# Patient Record
Sex: Female | Born: 1960 | Race: White | Hispanic: No | Smoking: Former smoker
Health system: Southern US, Community
[De-identification: ages and names within clinical notes are randomized; demographics above are authoritative.]

## PROBLEM LIST (undated history)

## (undated) HISTORY — PX: ABDOMINAL HYSTERECTOMY: SHX81

---

## 2001-04-10 ENCOUNTER — Emergency Department (HOSPITAL_COMMUNITY): Admission: EM | Admit: 2001-04-10 | Discharge: 2001-04-10 | Payer: Self-pay | Admitting: Emergency Medicine

## 2003-05-29 ENCOUNTER — Encounter: Payer: Self-pay | Admitting: Emergency Medicine

## 2003-05-29 ENCOUNTER — Emergency Department (HOSPITAL_COMMUNITY): Admission: EM | Admit: 2003-05-29 | Discharge: 2003-05-29 | Payer: Self-pay | Admitting: Emergency Medicine

## 2006-01-15 ENCOUNTER — Ambulatory Visit: Payer: Self-pay | Admitting: Family Medicine

## 2006-02-24 ENCOUNTER — Emergency Department (HOSPITAL_COMMUNITY): Admission: EM | Admit: 2006-02-24 | Discharge: 2006-02-24 | Payer: Self-pay | Admitting: Emergency Medicine

## 2009-02-09 ENCOUNTER — Emergency Department (HOSPITAL_COMMUNITY): Admission: EM | Admit: 2009-02-09 | Discharge: 2009-02-09 | Payer: Self-pay | Admitting: Emergency Medicine

## 2009-03-10 ENCOUNTER — Emergency Department (HOSPITAL_COMMUNITY): Admission: EM | Admit: 2009-03-10 | Discharge: 2009-03-10 | Payer: Self-pay | Admitting: Emergency Medicine

## 2009-05-10 ENCOUNTER — Emergency Department (HOSPITAL_COMMUNITY): Admission: EM | Admit: 2009-05-10 | Discharge: 2009-05-10 | Payer: Self-pay | Admitting: Emergency Medicine

## 2010-10-11 ENCOUNTER — Encounter (HOSPITAL_COMMUNITY): Payer: Self-pay

## 2010-10-11 ENCOUNTER — Emergency Department (HOSPITAL_COMMUNITY): Admit: 2010-10-11 | Discharge: 2010-10-11 | Disposition: A | Discharge status: Home / Self Care | Payer: Self-pay

## 2010-10-11 ENCOUNTER — Emergency Department (HOSPITAL_COMMUNITY)
Admission: EM | Admit: 2010-10-11 | Discharge: 2010-10-11 | Disposition: A | Discharge status: Home / Self Care | Payer: Self-pay | Attending: Emergency Medicine | Admitting: Emergency Medicine

## 2010-10-11 DIAGNOSIS — S40019A Contusion of unspecified shoulder, initial encounter: Secondary | ICD-10-CM | POA: Insufficient documentation

## 2010-10-11 DIAGNOSIS — W1809XA Striking against other object with subsequent fall, initial encounter: Secondary | ICD-10-CM | POA: Insufficient documentation

## 2010-10-11 DIAGNOSIS — Y92009 Unspecified place in unspecified non-institutional (private) residence as the place of occurrence of the external cause: Secondary | ICD-10-CM | POA: Insufficient documentation

## 2011-11-18 ENCOUNTER — Encounter (HOSPITAL_COMMUNITY): Payer: Self-pay | Admitting: *Deleted

## 2011-11-18 ENCOUNTER — Emergency Department (HOSPITAL_COMMUNITY)
Admission: EM | Admit: 2011-11-18 | Discharge: 2011-11-18 | Disposition: A | Discharge status: Home Health | Payer: Self-pay | Attending: Emergency Medicine | Admitting: Emergency Medicine

## 2011-11-18 DIAGNOSIS — Y998 Other external cause status: Secondary | ICD-10-CM | POA: Insufficient documentation

## 2011-11-18 DIAGNOSIS — Y9301 Activity, walking, marching and hiking: Secondary | ICD-10-CM | POA: Insufficient documentation

## 2011-11-18 DIAGNOSIS — S39012A Strain of muscle, fascia and tendon of lower back, initial encounter: Secondary | ICD-10-CM

## 2011-11-18 DIAGNOSIS — W010XXA Fall on same level from slipping, tripping and stumbling without subsequent striking against object, initial encounter: Secondary | ICD-10-CM | POA: Insufficient documentation

## 2011-11-18 DIAGNOSIS — F17200 Nicotine dependence, unspecified, uncomplicated: Secondary | ICD-10-CM | POA: Insufficient documentation

## 2011-11-18 DIAGNOSIS — S20229A Contusion of unspecified back wall of thorax, initial encounter: Secondary | ICD-10-CM | POA: Insufficient documentation

## 2011-11-18 MED ORDER — HYDROCODONE-ACETAMINOPHEN 5-325 MG PO TABS
1.0000 | ORAL_TABLET | Freq: Once | ORAL | Status: DC
  Filled 2011-11-18: qty 1

## 2011-11-18 MED ORDER — BACLOFEN 10 MG PO TABS: 10.0000 mg | ORAL_TABLET | Freq: Three times a day (TID) | ORAL | Status: DC

## 2011-11-18 MED ORDER — METHOCARBAMOL 500 MG PO TABS
1000.0000 mg | ORAL_TABLET | Freq: Once | ORAL | Status: DC
  Filled 2011-11-18: qty 2

## 2011-11-18 MED ORDER — HYDROCODONE-ACETAMINOPHEN 5-325 MG PO TABS: 1.0000 | ORAL_TABLET | ORAL | Status: AC | PRN

## 2011-11-18 MED ORDER — ONDANSETRON HCL 4 MG PO TABS
4.0000 mg | ORAL_TABLET | Freq: Once | ORAL | Status: DC
  Filled 2011-11-18: qty 1

## 2011-11-18 NOTE — Discharge Instructions (Signed)
Please apply ice to the affected area of the lower back. Use baclofen 3 times daily for spasm. Use Tylenol for mild pain, use Norco for more severe pain. Norco and baclofen may cause drowsiness, please use with caution.Please call Dr Lysbeth Galas for recheck next week. See Dr Hilda Lias for evaluation if orthopedic evaluation needed. Contusion A contusion is a deep bruise. Contusions are the result of an injury that caused bleeding under the skin. The contusion may turn blue, purple, or yellow. Minor injuries will give you a painless contusion, but more severe contusions may stay painful and swollen for a few weeks.  CAUSES  A contusion is usually caused by a blow, trauma, or direct force to an area of the body. SYMPTOMS   Swelling and redness of the injured area.   Bruising of the injured area.   Tenderness and soreness of the injured area.   Pain.  DIAGNOSIS  The diagnosis can be made by taking a history and physical exam. An X-ray, CT scan, or MRI may be needed to determine if there were any associated injuries, such as fractures. TREATMENT  Specific treatment will depend on what area of the body was injured. In general, the best treatment for a contusion is resting, icing, elevating, and applying cold compresses to the injured area. Over-the-counter medicines may also be recommended for pain control. Ask your caregiver what the best treatment is for your contusion. HOME CARE INSTRUCTIONS   Put ice on the injured area.   Put ice in a plastic bag.   Place a towel between your skin and the bag.   Leave the ice on for 15 to 20 minutes, 3 to 4 times a day.   Only take over-the-counter or prescription medicines for pain, discomfort, or fever as directed by your caregiver. Your caregiver may recommend avoiding anti-inflammatory medicines (aspirin, ibuprofen, and naproxen) for 48 hours because these medicines may increase bruising.   Rest the injured area.   If possible, elevate the injured area to  reduce swelling.  SEEK IMMEDIATE MEDICAL CARE IF:   You have increased bruising or swelling.   You have pain that is getting worse.   Your swelling or pain is not relieved with medicines.  MAKE SURE YOU:   Understand these instructions.   Will watch your condition.   Will get help right away if you are not doing well or get worse.  Document Released: 06/06/2005 Document Revised: 08/16/2011 Document Reviewed: 07/02/2011 [facility] Patient Information 2012 [place], [place].

## 2011-11-18 NOTE — ED Notes (Signed)
Pt states was up in middle of the night, fell and tripped on hearth of fireplace hitting lower back . Pt has noted abrasions to lower back.

## 2011-11-18 NOTE — ED Notes (Signed)
Pt DC to home with steady gait 

## 2011-11-18 NOTE — ED Provider Notes (Signed)
History     CSN: [id number]  Arrival date & time 11/18/11  1356   First MD Initiated Contact with Patient 11/18/11 1547      Chief Complaint  Patient presents with  . Fall  . Abrasion    (Consider location/radiation/quality/duration/timing/severity/associated sxs/prior treatment) Patient is a 51 y.o. female presenting with fall. The history is provided by the patient.  Fall The accident occurred yesterday. The fall occurred while walking. She landed on a hard floor. Point of impact: loower back. Pain location: lower back. She was ambulatory at the scene.    History reviewed. No pertinent past medical history.  Past Surgical History  Procedure Date  . Abdominal hysterectomy     History reviewed. No pertinent family history.  History  Substance Use Topics  . Smoking status: Current Everyday Smoker -- 1.0 packs/day    Types: Cigarettes  . Smokeless tobacco: Not on file  . Alcohol Use: No    OB History    Grav Para Term Preterm Abortions TAB SAB Ect Mult Living                  Review of Systems  Allergies  Review of patient's allergies indicates not on file.  Home Medications  No current outpatient prescriptions on file.  BP 134/73  Pulse 78  Temp(Src) 98.6 F (37 C) (Oral)  Resp 20  Ht 5\' 2"  (1.575 m)  Wt 130 lb (58.968 kg)  BMI 23.78 kg/m2  SpO2 97%  Physical Exam  Nursing note and vitals reviewed. Constitutional: She is oriented to person, place, and time. She appears well-developed and well-nourished.  Non-toxic appearance.  HENT:  Head: Normocephalic.  Right Ear: Tympanic membrane and external ear normal.  Left Ear: Tympanic membrane and external ear normal.  Eyes: EOM and lids are normal. Pupils are equal, round, and reactive to light.  Neck: Normal range of motion. Neck supple. Carotid bruit is not present.  Cardiovascular: Normal rate, regular rhythm, normal heart sounds, intact distal pulses and normal pulses.   Pulmonary/Chest: Breath  sounds normal. No respiratory distress.  Abdominal: Soft. Bowel sounds are normal. There is no tenderness. There is no guarding.  Musculoskeletal: Normal range of motion.       Patient has soreness with attempted range of motion of the lower back. There is bruising over the lower lumbar area. There is no tenderness at the coccyx area. There is full range of motion of both upper extremities.  Lymphadenopathy:       Head (right side): No submandibular adenopathy present.       Head (left side): No submandibular adenopathy present.    She has no cervical adenopathy.  Neurological: She is alert and oriented to person, place, and time. She has normal strength. No cranial nerve deficit or sensory deficit. She exhibits normal muscle tone. Coordination normal.       Gait within normal limits.  Skin: Skin is warm and dry.  Psychiatric: She has a normal mood and affect. Her speech is normal.    ED Course  Procedures (including critical care time) Pulse oximetry 97% on room air. Within normal limits by my interpretation. Labs Reviewed - No data to display No results found.   No diagnosis found.    MDM  I have reviewed nursing notes, vital signs, and all appropriate lab and imaging results for this patient. Patient states she tripped over something in the floor on last evening and injured her lower back and buttocks on the hearth  of a fireplace. She has been able to get up and get about since the fall, but has increasing pain and soreness. There's been no loss of bowel or bladder functions. Prescription for baclofen 10 mg #21, hydrocodone 5 mg #15 given to the patient. The patient is to call her primary physician on March 11 for office appointment this week.       Kathie Dike, [place] 11/18/11 1614

## 2011-11-19 NOTE — ED Provider Notes (Signed)
Medical screening examination/treatment/procedure(s) were performed by non-physician practitioner and as supervising physician I was immediately available for consultation/collaboration.   Skyler Dusing, MD 11/19/11 0041 

## 2011-12-11 ENCOUNTER — Emergency Department (HOSPITAL_COMMUNITY): Payer: Self-pay

## 2011-12-11 ENCOUNTER — Encounter (HOSPITAL_COMMUNITY): Payer: Self-pay | Admitting: Emergency Medicine

## 2011-12-11 ENCOUNTER — Emergency Department (HOSPITAL_COMMUNITY)
Admission: EM | Admit: 2011-12-11 | Discharge: 2011-12-11 | Disposition: A | Discharge status: Home / Self Care | Payer: Self-pay | Attending: Emergency Medicine | Admitting: Emergency Medicine

## 2011-12-11 DIAGNOSIS — R209 Unspecified disturbances of skin sensation: Secondary | ICD-10-CM | POA: Insufficient documentation

## 2011-12-11 DIAGNOSIS — S60222A Contusion of left hand, initial encounter: Secondary | ICD-10-CM

## 2011-12-11 DIAGNOSIS — M79609 Pain in unspecified limb: Secondary | ICD-10-CM | POA: Insufficient documentation

## 2011-12-11 DIAGNOSIS — S60229A Contusion of unspecified hand, initial encounter: Secondary | ICD-10-CM | POA: Insufficient documentation

## 2011-12-11 DIAGNOSIS — W208XXA Other cause of strike by thrown, projected or falling object, initial encounter: Secondary | ICD-10-CM | POA: Insufficient documentation

## 2011-12-11 DIAGNOSIS — S63502A Unspecified sprain of left wrist, initial encounter: Secondary | ICD-10-CM

## 2011-12-11 DIAGNOSIS — R609 Edema, unspecified: Secondary | ICD-10-CM | POA: Insufficient documentation

## 2011-12-11 DIAGNOSIS — S63509A Unspecified sprain of unspecified wrist, initial encounter: Secondary | ICD-10-CM | POA: Insufficient documentation

## 2011-12-11 DIAGNOSIS — M25539 Pain in unspecified wrist: Secondary | ICD-10-CM | POA: Insufficient documentation

## 2011-12-11 DIAGNOSIS — M7989 Other specified soft tissue disorders: Secondary | ICD-10-CM | POA: Insufficient documentation

## 2011-12-11 DIAGNOSIS — IMO0002 Reserved for concepts with insufficient information to code with codable children: Secondary | ICD-10-CM | POA: Insufficient documentation

## 2011-12-11 MED ORDER — IBUPROFEN 600 MG PO TABS: 600.0000 mg | ORAL_TABLET | Freq: Once | ORAL | Status: AC

## 2011-12-11 MED ORDER — IBUPROFEN 400 MG PO TABS
600.0000 mg | ORAL_TABLET | Freq: Once | ORAL | Status: AC
  Administered 2011-12-11: 600 mg via ORAL
  Filled 2011-12-11: qty 2

## 2011-12-11 MED ORDER — HYDROCODONE-ACETAMINOPHEN 5-325 MG PO TABS
2.0000 | ORAL_TABLET | Freq: Once | ORAL | Status: AC
  Administered 2011-12-11: 2 via ORAL
  Filled 2011-12-11: qty 2

## 2011-12-11 MED ORDER — HYDROCODONE-ACETAMINOPHEN 5-325 MG PO TABS: 2.0000 | ORAL_TABLET | ORAL | Status: AC | PRN

## 2011-12-11 NOTE — Discharge Instructions (Signed)
Cryotherapy Cryotherapy means treatment with cold. Ice or gel packs can be used to reduce both pain and swelling. Ice is the most helpful within the first 24 to 48 hours after an injury or flareup from overusing a muscle or joint. Sprains, strains, spasms, burning pain, shooting pain, and aches can all be eased with ice. Ice can also be used when recovering from surgery. Ice is effective, has very few side effects, and is safe for most people to use. PRECAUTIONS  Ice is not a safe treatment option for people with:  Raynaud's phenomenon. This is a condition affecting small blood vessels in the extremities. Exposure to cold may cause your problems to return.   Cold hypersensitivity. There are many forms of cold hypersensitivity, including:   Cold urticaria. Red, itchy hives appear on the skin when the tissues begin to warm after being iced.   Cold erythema. This is a red, itchy rash caused by exposure to cold.   Cold hemoglobinuria. Red blood cells break down when the tissues begin to warm after being iced. The hemoglobin that carry oxygen are passed into the urine because they cannot combine with blood proteins fast enough.   Numbness or altered sensitivity in the area being iced.  If you have any of the following conditions, do not use ice until you have discussed cryotherapy with your caregiver:  Heart conditions, such as arrhythmia, angina, or chronic heart disease.   High blood pressure.   Healing wounds or open skin in the area being iced.   Current infections.   Rheumatoid arthritis.   Poor circulation.   Diabetes.  Ice slows the blood flow in the region it is applied. This is beneficial when trying to stop inflamed tissues from spreading irritating chemicals to surrounding tissues. However, if you expose your skin to cold temperatures for too long or without the proper protection, you can damage your skin or nerves. Watch for signs of skin damage due to cold. HOME CARE  INSTRUCTIONS Follow these tips to use ice and cold packs safely.  Place a dry or damp towel between the ice and skin. A damp towel will cool the skin more quickly, so you may need to shorten the time that the ice is used.   For a more rapid response, add gentle compression to the ice.   Ice for no more than 10 to 20 minutes at a time. The bonier the area you are icing, the less time it will take to get the benefits of ice.   Check your skin after 5 minutes to make sure there are no signs of a poor response to cold or skin damage.   Rest 20 minutes or more in between uses.   Once your skin is numb, you can end your treatment. You can test numbness by very lightly touching your skin. The touch should be so light that you do not see the skin dimple from the pressure of your fingertip. When using ice, most people will feel these normal sensations in this order: cold, burning, aching, and numbness.   Do not use ice on someone who cannot communicate their responses to pain, such as small children or people with dementia.  HOW TO MAKE AN ICE PACK Ice packs are the most common way to use ice therapy. Other methods include ice massage, ice baths, and cryo-sprays. Muscle creams that cause a cold, tingly feeling do not offer the same benefits that ice offers and should not be used as a substitute  unless recommended by your caregiver. To make an ice pack, do one of the following:  Place crushed ice or a bag of frozen vegetables in a sealable plastic bag. Squeeze out the excess air. Place this bag inside another plastic bag. Slide the bag into a pillowcase or place a damp towel between your skin and the bag.   Mix 3 parts water with 1 part rubbing alcohol. Freeze the mixture in a sealable plastic bag. When you remove the mixture from the freezer, it will be slushy. Squeeze out the excess air. Place this bag inside another plastic bag. Slide the bag into a pillowcase or place a damp towel between your skin  and the bag.  SEEK MEDICAL CARE IF:  You develop white spots on your skin. This may give the skin a blotchy (mottled) appearance.   Your skin turns blue or pale.   Your skin becomes waxy or hard.   Your swelling gets worse.  MAKE SURE YOU:   Understand these instructions.   Will watch your condition.   Will get help right away if you are not doing well or get worse.  Document Released: 04/23/2011 Document Revised: 08/16/2011 Document Reviewed: 04/23/2011 [facility] Patient Information 2012 [place], [place].Contusion A contusion is a deep bruise. Contusions are the result of an injury that caused bleeding under the skin. The contusion may turn blue, purple, or yellow. Minor injuries will give you a painless contusion, but more severe contusions may stay painful and swollen for a few weeks.  CAUSES  A contusion is usually caused by a blow, trauma, or direct force to an area of the body. SYMPTOMS   Swelling and redness of the injured area.   Bruising of the injured area.   Tenderness and soreness of the injured area.   Pain.  DIAGNOSIS  The diagnosis can be made by taking a history and physical exam. An X-ray, CT scan, or MRI may be needed to determine if there were any associated injuries, such as fractures. TREATMENT  Specific treatment will depend on what area of the body was injured. In general, the best treatment for a contusion is resting, icing, elevating, and applying cold compresses to the injured area. Over-the-counter medicines may also be recommended for pain control. Ask your caregiver what the best treatment is for your contusion. HOME CARE INSTRUCTIONS   Put ice on the injured area.   Put ice in a plastic bag.   Place a towel between your skin and the bag.   Leave the ice on for 15 to 20 minutes, 3 to 4 times a day.   Only take over-the-counter or prescription medicines for pain, discomfort, or fever as directed by your caregiver. Your caregiver may recommend  avoiding anti-inflammatory medicines (aspirin, ibuprofen, and naproxen) for 48 hours because these medicines may increase bruising.   Rest the injured area.   If possible, elevate the injured area to reduce swelling.  SEEK IMMEDIATE MEDICAL CARE IF:   You have increased bruising or swelling.   You have pain that is getting worse.   Your swelling or pain is not relieved with medicines.  MAKE SURE YOU:   Understand these instructions.   Will watch your condition.   Will get help right away if you are not doing well or get worse.  Document Released: 06/06/2005 Document Revised: 08/16/2011 Document Reviewed: 07/02/2011 [facility] Patient Information 2012 [place], [place].Contusion (Bruise) of Hand An injury to the hand may cause bruises (contusions). Contusions are caused by bleeding from  small blood vessels (capillaries) that allow blood to leak out into the muscles, tendons, and surrounding soft tissue. This is followed by swelling and pain (inflammation). Contusions of the hand are common because of the use of hands in daily and recreational activities. Signs of a hand injury include pain, swelling, and a color change. Initially the skin may turn blue to purple in color. As the bruise ages, the color turns yellow and orange. Swelling may decrease the movement of the fingers. Contusions are seen more commonly with:  Contact sports (especially in football, wrestling, and basketball).   Use of medications that thin the blood (anticoagulants).   Use of aspirin and nonsteroidal anti-inflammatory agents that decrease the ability of the blood to clot.   Vitamin deficiencies.   Aging.  DIAGNOSIS  Diagnosis of hand injuries can be made by your own observation. If problems continue, a caregiver may be required for further evaluation and treatment. X-rays may be required to make sure there are no broken bones (fractures). Continued problems may require physical therapy for treatment. RISKS AND  COMPLICATIONS  Extensive bleeding and tissue inflammation. This can lead to disability and arthritis-type problems later on if the hand does not heal properly.   Infection of the hand if there are breaks in the skin. This is especially true if the hand injury came from someone's teeth, such as would occur with punching someone in the mouth. This can lead to an infection of the tendons and the membranes surrounding the tendons (sheaths). This infection can have severe complications including a loss of function (a "frozen" hand).   Rupture of the tendons requiring a surgical repair. Failure to repair the tendons can result in loss of function of the hand or fingers.  HOME CARE INSTRUCTIONS   Apply ice to the injury for 15 to 20 minutes, 3 to 4 times per day. Put the ice in a plastic bag and place a towel between the bag of ice and your skin.   An elastic bandage may be used initially for support and to minimize swelling. Do not wrap the hand too tightly. Do not sleep with the elastic bandage on.   Gentle massage from the fingertips towards the elbow will help keep the swelling down. Gently open and close your fist while doing this to maintain range of motion. Do this only after the first few days, when there is no or minimal pain.   Keep your hand above the level of the heart when swelling and pain are present. This will allow the fluid to drain out of the hand, decreasing the amount of swelling. This will improve healing time.   Try to avoid use of the injured hand (except for gentle range of motion) while the hand is hurting. Do not resume use until instructed by your caregiver. Then begin use gradually, do not increase use to the point of pain. If pain does develop, decrease use and continue the above measures, gradually increasing activities that do not cause discomfort until you achieve normal use.   Only take over-the-counter or prescription medicines for pain, discomfort, or fever as directed  by your caregiver.   Follow up with your caregiver as directed. Follow-up care may include orthopedic referrals, physical therapy, and rehabilitation. Any delay in obtaining necessary care could result in delayed healing, or temporary or permanent disability.  REHABILITATION  Begin daily rehabilitation exercises when an elastic bandage is no longer needed and you are either pain free or only have minimal pain.  Use ice massage for 10 minutes before and after workouts. Put ice in a plastic bag and place a towel between the bag of ice and your skin. Massage the injured area with the ice pack.  SEEK IMMEDIATE MEDICAL CARE IF:   Your pain and swelling increase, or pain is uncontrolled with medications.   You have loss of feeling in your hand, or your hand turns cold or blue.   An oral temperature above 102 F (38.9 C) develops, not controlled by medication.   Your hand becomes warm to the touch, or you have increased pain with even slight movement of your fingers.   Your hand does not begin to improve in 1 or 2 days.   The skin is broken and signs of infection occur (fluid draining from the contusion, increasing pain, fever, headache, muscle aches, dizziness, or a general ill feeling).   You develop new, unexplained problems, or an increase of the symptoms that brought you to your caregiver.  MAKE SURE YOU:   Understand these instructions.   Will watch your condition.   Will get help right away if you are not doing well or get worse.  Document Released: 02/16/2002 Document Revised: 08/16/2011 Document Reviewed: 02/03/2010 [facility] Patient Information 2012 [place], [place].Wrist Pain Wrist injuries are frequent in adults and children. A sprain is an injury to the ligaments that hold your bones together. A strain is an injury to muscle or muscle cord-like structures (tendons) from stretching or pulling. Generally, when wrists are moderately tender to touch following a fall or injury, a  break in the bone (fracture) may be present. Most wrist sprains or strains are better in 3 to 5 days, but complete healing may take several weeks. HOME CARE INSTRUCTIONS   Put ice on the injured area.   Put ice in a plastic bag.   Place a towel between your skin and the bag.   Leave the ice on for 15 to 20 minutes, 3 to 4 times a day, for the first 2 days.   Keep your arm raised above the level of your heart whenever possible to reduce swelling and pain.   Rest the injured area for at least 48 hours or as directed by your caregiver.   If a splint or elastic bandage has been applied, use it for as long as directed by your caregiver or until seen by a caregiver for a follow-up exam.   Only take over-the-counter or prescription medicines for pain, discomfort, or fever as directed by your caregiver.   Keep all follow-up appointments. You may need to follow up with a specialist or have follow-up X-rays. Improvement in pain level is not a guarantee that you did not fracture a bone in your wrist. The only way to determine whether or not you have a broken bone is by X-ray.  SEEK IMMEDIATE MEDICAL CARE IF:   Your fingers are swollen, very red, white, or cold and blue.   Your fingers are numb or tingling.   You have increasing pain.   You have difficulty moving your fingers.  MAKE SURE YOU:   Understand these instructions.   Will watch your condition.   Will get help right away if you are not doing well or get worse.  Document Released: 06/06/2005 Document Revised: 08/16/2011 Document Reviewed: 10/18/2010 [facility] Patient Information 2012 [place], [place].

## 2011-12-11 NOTE — ED Provider Notes (Signed)
History  This chart was scribed for Felisa Bonier, MD by Bennett Scrape. This patient was seen in room APA12/APA12 and the patient's care was started at 7:42PM.  CSN: [id number]  Arrival date & time 12/11/11  1844   First MD Initiated Contact with Patient 12/11/11 1925      Chief Complaint  Patient presents with  . Wrist Pain  . Hand Pain    The history is provided by the patient. No language interpreter was used.    Joanne Larson is a 51 y.o. female who presents to the Emergency Department complaining of constant left hand pain that occurred about 30 minutes PTA. Pt states that the pain started suddenly and has been gradually worsening after a dresser fell on her hand while she was moving it. The pain is worse with touch and movement of the left hand. She reports having left hand numbness described as a tingling feeling as an associated symptoms. She denies taking any medications PTA to improve pain. She denies any other injuries or illnesses at this time including sore throat, cough, chest pain, abdominal pain and HA. Pt has no h/o chronic medical conditions. She is a current everyday smoker but denies alcohol use.  History reviewed. No pertinent past medical history.  Past Surgical History  Procedure Date  . Abdominal hysterectomy   . Cesarean section     No family history on file.  History  Substance Use Topics  . Smoking status: Current Everyday Smoker -- 1.0 packs/day    Types: Cigarettes  . Smokeless tobacco: Not on file  . Alcohol Use: No    Review of Systems  Constitutional: Negative for fever and chills.  HENT: Negative for congestion, sore throat and neck pain.   Eyes: Negative for pain.  Respiratory: Negative for cough.   Gastrointestinal: Negative for nausea, vomiting and diarrhea.  Genitourinary: Negative for dysuria, urgency and hematuria.  Musculoskeletal: Negative for back pain.       Left hand pain  Skin: Negative for rash.  Neurological:  Positive for numbness (Left hand numbness). Negative for seizures.  Psychiatric/Behavioral: Negative for confusion.    Allergies  Review of patient's allergies indicates no known allergies.  Home Medications   Current Outpatient Rx  Name Route Sig Dispense Refill  . BACLOFEN 10 MG PO TABS Oral Take 1 tablet (10 mg total) by mouth 3 (three) times daily. 21 each 0    Triage Vitals: BP 115/76  Pulse 92  Temp 98 F (36.7 C)  Resp 18  Ht 5\' 2"  (1.575 m)  Wt 130 lb (58.968 kg)  BMI 23.78 kg/m2  SpO2 100%  Physical Exam  Nursing note and vitals reviewed. Constitutional: She is oriented to person, place, and time. She appears well-developed and well-nourished. No distress.  HENT:  Head: Normocephalic and atraumatic.  Eyes: Conjunctivae and EOM are normal. Pupils are equal, round, and reactive to light.  Neck: Normal range of motion. Neck supple.  Cardiovascular: Normal rate, regular rhythm and intact distal pulses.   Pulmonary/Chest: Effort normal and breath sounds normal. No accessory muscle usage. No respiratory distress.  Abdominal: Normal appearance. She exhibits no distension.  Musculoskeletal: Normal range of motion. She exhibits edema and tenderness.       Edema and tenderness to palpation over the base of the 2nd and 3rd metacarpals of the left hand,  With contusion and abrasion present.  No pain or tenderness of the phalanges, MCP joints, or distal fingers.  Preserved distal capillary refill,  movement, and sensation.  No pain or tenderness proximal to the injury site at the forearm, elbow, shoulder, or elsewhere.  Neurological: She is alert and oriented to person, place, and time. She has normal strength and normal reflexes. No cranial nerve deficit. Coordination normal. GCS eye subscore is 4. GCS verbal subscore is 5. GCS motor subscore is 6.  Skin: Skin is warm and dry. No rash noted.       Abrasion, contusion over base of 2nd and 3rd metacarpal of left hand  Psychiatric:  She has a normal mood and affect. Her behavior is normal. Judgment and thought content normal.    ED Course  Procedures (including critical care time)  DIAGNOSTIC STUDIES: Oxygen Saturation is 100% on room air, normal by my interpretation.    COORDINATION OF CARE: 7:43PM-Discussed negative radiology findings with pt and pt acknowledged results. Discussed pain medication and splint with pt and pt agreed to plan.   Labs Reviewed - No data to display  Dg Wrist Complete Left  12/11/2011  *RADIOLOGY REPORT*  Clinical Data: Left wrist injury.  LEFT WRIST - COMPLETE 3+ VIEW  Comparison: None  Findings: The joint spaces are maintained.  No acute fracture. Small cysts are noted in the scaphoid and capitate.  IMPRESSION: No acute fracture.  Original Report Authenticated By: P. Loralie Champagne, M.D.     No diagnosis found.    MDM  Contusion vs. Fracture of metacarpals of left hand    I personally performed the services described in this documentation, which was scribed in my presence. The recorded information has been reviewed and considered.   Felisa Bonier, MD 12/11/11 [phone]

## 2011-12-11 NOTE — ED Notes (Signed)
Pt c/o left wrist.hand pain after a dresser fell on it.

## 2012-09-22 ENCOUNTER — Encounter (HOSPITAL_COMMUNITY): Payer: Self-pay | Admitting: *Deleted

## 2012-09-22 ENCOUNTER — Emergency Department (HOSPITAL_COMMUNITY)
Admission: EM | Admit: 2012-09-22 | Discharge: 2012-09-22 | Disposition: A | Discharge status: Home / Self Care | Payer: Self-pay | Attending: Emergency Medicine | Admitting: Emergency Medicine

## 2012-09-22 DIAGNOSIS — F17200 Nicotine dependence, unspecified, uncomplicated: Secondary | ICD-10-CM | POA: Insufficient documentation

## 2012-09-22 DIAGNOSIS — K089 Disorder of teeth and supporting structures, unspecified: Secondary | ICD-10-CM | POA: Insufficient documentation

## 2012-09-22 DIAGNOSIS — Z7982 Long term (current) use of aspirin: Secondary | ICD-10-CM | POA: Insufficient documentation

## 2012-09-22 DIAGNOSIS — Z87828 Personal history of other (healed) physical injury and trauma: Secondary | ICD-10-CM | POA: Insufficient documentation

## 2012-09-22 DIAGNOSIS — K0889 Other specified disorders of teeth and supporting structures: Secondary | ICD-10-CM

## 2012-09-22 DIAGNOSIS — K029 Dental caries, unspecified: Secondary | ICD-10-CM | POA: Insufficient documentation

## 2012-09-22 MED ORDER — HYDROCODONE-ACETAMINOPHEN 5-325 MG PO TABS
1.0000 | ORAL_TABLET | Freq: Once | ORAL | Status: AC
  Administered 2012-09-22: 1 via ORAL
  Filled 2012-09-22: qty 1

## 2012-09-22 MED ORDER — AMOXICILLIN 500 MG PO CAPS: 500.0000 mg | ORAL_CAPSULE | Freq: Three times a day (TID) | ORAL | Status: DC

## 2012-09-22 MED ORDER — HYDROCODONE-ACETAMINOPHEN 5-325 MG PO TABS: 1.0000 | ORAL_TABLET | ORAL | Status: AC | PRN

## 2012-09-22 NOTE — ED Notes (Signed)
Pt with broken tooth to left lower back molar, pt has an appt. In 2 weeks and also tried to get in with dentist sooner today but unable, pt with pain that radiates into her head

## 2012-09-22 NOTE — ED Provider Notes (Signed)
Medical screening examination/treatment/procedure(s) were performed by non-physician practitioner and as supervising physician I was immediately available for consultation/collaboration.   Joya Gaskins, MD 09/22/12 [phone]

## 2012-09-22 NOTE — ED Notes (Signed)
Back tooth broke off over the weekend. Next appt with dentist is in 2 weeks and they are unable to work pt in.

## 2012-09-22 NOTE — ED Provider Notes (Signed)
History     CSN: [id number]  Arrival date & time 09/22/12  1316   First MD Initiated Contact with Patient 09/22/12 1326      Chief Complaint  Patient presents with  . Dental Pain    (Consider location/radiation/quality/duration/timing/severity/associated sxs/prior treatment) HPI Comments: Joanne Larson is a 52 y.o. female  presents with a 2 day history of dental pain and gingival swelling.   The patient has a history of injury to the tooth involved which has recently started to cause pain.   She is currently undergoing slow dental extractions in anticipation of dentures as she has severe dental decay.  She fractured a tooth 2 days ago when eating and now has sever pain at the site.  Her dentist in [place] was not able to see her today, her next appt with him is in 2 weeks. There has been no fevers,  Chills, nausea or vomiting, also no complaint of difficulty swallowing,  Although chewing makes pain worse.  The patient has tried ibuprofen without relief of symptoms.      The history is provided by the patient.    History reviewed. No pertinent past medical history.  Past Surgical History  Procedure Date  . Abdominal hysterectomy   . Cesarean section     No family history on file.  History  Substance Use Topics  . Smoking status: Current Every Day Smoker -- 1.0 packs/day    Types: Cigarettes  . Smokeless tobacco: Not on file  . Alcohol Use: No    OB History    Grav Para Term Preterm Abortions TAB SAB Ect Mult Living                  Review of Systems  Constitutional: Negative for fever.  HENT: Positive for dental problem. Negative for sore throat, facial swelling, neck pain and neck stiffness.   Respiratory: Negative for shortness of breath.     Allergies  Review of patient's allergies indicates no known allergies.  Home Medications   Current Outpatient Rx  Name  Route  Sig  Dispense  Refill  . AMOXICILLIN 500 MG PO CAPS   Oral   Take 1 capsule (500 mg total)  by mouth 3 (three) times daily.   30 capsule   0   . ASPIRIN 325 MG PO TABS   Oral   Take 650 mg by mouth once as needed. For pain         . HYDROCODONE-ACETAMINOPHEN 5-325 MG PO TABS   Oral   Take 1 tablet by mouth every 4 (four) hours as needed for pain.   20 tablet   0     BP 116/84  Pulse 95  Temp 98 F (36.7 C) (Oral)  Resp 20  Ht 5\' 2"  (1.575 m)  Wt 112 lb (50.803 kg)  BMI 20.49 kg/m2  SpO2 97%  Physical Exam  Constitutional: She is oriented to person, place, and time. She appears well-developed and well-nourished. No distress.  HENT:  Head: Normocephalic and atraumatic.  Right Ear: Tympanic membrane and external ear normal.  Left Ear: Tympanic membrane and external ear normal.  Mouth/Throat: Oropharynx is clear and moist and mucous membranes are normal. No oral lesions. Dental abscesses present.       Multiple caries in dental fractures, patient is partially Edentulous most remaining teeth lower dentition.  Left lower second premolar with appreciable new fracture.  No surrounding gingival edema or erythema, no drainage.  Eyes: Conjunctivae normal are normal.  Neck: Normal range of motion. Neck supple.  Cardiovascular: Normal rate and normal heart sounds.   Pulmonary/Chest: Effort normal.  Abdominal: She exhibits no distension.  Musculoskeletal: Normal range of motion.  Lymphadenopathy:    She has no cervical adenopathy.  Neurological: She is alert and oriented to person, place, and time.  Skin: Skin is warm and dry. No erythema.  Psychiatric: She has a normal mood and affect.    ED Course  Procedures (including critical care time)  Labs Reviewed - No data to display No results found.   1. Pain, dental       MDM  Pt prescribed hydrocodone.  Also prescribed amoxil,  No sign of infection currently,  Asked to hold script and get filled only for redness,  Swelling or drainage around tooth.        Burgess Amor, [place] 09/22/12 [phone]

## 2013-04-23 ENCOUNTER — Emergency Department (HOSPITAL_COMMUNITY)
Admission: EM | Admit: 2013-04-23 | Discharge: 2013-04-23 | Disposition: A | Discharge status: Home / Self Care | Payer: Self-pay | Attending: Emergency Medicine | Admitting: Emergency Medicine

## 2013-04-23 ENCOUNTER — Encounter (HOSPITAL_COMMUNITY): Payer: Self-pay | Admitting: Emergency Medicine

## 2013-04-23 DIAGNOSIS — M5412 Radiculopathy, cervical region: Secondary | ICD-10-CM | POA: Insufficient documentation

## 2013-04-23 DIAGNOSIS — Z792 Long term (current) use of antibiotics: Secondary | ICD-10-CM | POA: Insufficient documentation

## 2013-04-23 DIAGNOSIS — F17200 Nicotine dependence, unspecified, uncomplicated: Secondary | ICD-10-CM | POA: Insufficient documentation

## 2013-04-23 DIAGNOSIS — M2550 Pain in unspecified joint: Secondary | ICD-10-CM | POA: Insufficient documentation

## 2013-04-23 MED ORDER — CYCLOBENZAPRINE HCL 10 MG PO TABS: 10.0000 mg | ORAL_TABLET | Freq: Three times a day (TID) | ORAL | Status: DC | PRN

## 2013-04-23 MED ORDER — HYDROCODONE-ACETAMINOPHEN 5-325 MG PO TABS: ORAL_TABLET | ORAL | Status: DC

## 2013-04-23 MED ORDER — PREDNISONE 10 MG PO TABS: ORAL_TABLET | ORAL | Status: DC

## 2013-04-23 NOTE — ED Notes (Addendum)
Patient c/o neck pain. Per patient woke 2 days ago and was unable to turn neck. Patient reports "pinching sensation that radiates from neck into right arm. Reports taking advil with no relief.

## 2013-04-23 NOTE — ED Notes (Signed)
Post neck pain with "shooting pains" down rt arm. No known injury, Alert,

## 2013-04-23 NOTE — ED Provider Notes (Signed)
CSN: [id number]     Arrival date & time 04/23/13  1403 History     First MD Initiated Contact with Patient 04/23/13 1440     Chief Complaint  Patient presents with  . Neck Pain   (Consider location/radiation/quality/duration/timing/severity/associated sxs/prior Treatment) HPI Comments: ZAKARIAH DEJARNETTE is a 52 y.o. female who presents to the Emergency Department complaining of right sided neck pain that began 2 days ago.  States the pain radiates into her right upper arm.  She describes the pain as a sharp "pinching sensation" that's worse when she tries to move her neck left or right.  She denies known injury, chest pain, numbness of the UE's, nausea, headaches, visual changes, dizziness or shortness of breath.  Movement makes the pain worse and it improves with rest.  Patient is a 52 y.o. female presenting with neck injury. The history is provided by the patient.  Neck Injury This is a new problem. The current episode started in the past 7 days. The problem occurs constantly. The problem has been unchanged. Associated symptoms include arthralgias and neck pain. Pertinent negatives include no abdominal pain, chest pain, chills, fever, headaches, joint swelling, nausea, numbness, rash, sore throat, swollen glands, visual change, vomiting or weakness. The symptoms are aggravated by twisting (movement of the neck and right arm). She has tried NSAIDs for the symptoms. The treatment provided no relief.    History reviewed. No pertinent past medical history. Past Surgical History  Procedure Laterality Date  . Abdominal hysterectomy    . Cesarean section     History reviewed. No pertinent family history. History  Substance Use Topics  . Smoking status: Current Every Day Smoker -- 1.00 packs/day for 10 years    Types: Cigarettes  . Smokeless tobacco: Never Used  . Alcohol Use: No   OB History   Grav Para Term Preterm Abortions TAB SAB Ect Mult Living   4 2 2  2  2   2      Review of  Systems  Constitutional: Negative for fever and chills.  HENT: Positive for neck pain. Negative for sore throat, facial swelling, trouble swallowing and neck stiffness.   Eyes: Negative for visual disturbance.  Respiratory: Negative for chest tightness and shortness of breath.   Cardiovascular: Negative for chest pain.  Gastrointestinal: Negative for nausea, vomiting and abdominal pain.  Genitourinary: Negative for dysuria and difficulty urinating.  Musculoskeletal: Positive for arthralgias. Negative for joint swelling.  Skin: Negative for color change, rash and wound.  Neurological: Negative for dizziness, syncope, facial asymmetry, speech difficulty, weakness, numbness and headaches.  All other systems reviewed and are negative.    Allergies  Review of patient's allergies indicates no known allergies.  Home Medications   Current Outpatient Rx  Name  Route  Sig  Dispense  Refill  . amoxicillin (AMOXIL) 500 MG capsule   Oral   Take 1 capsule (500 mg total) by mouth 3 (three) times daily.   30 capsule   0   . aspirin 325 MG tablet   Oral   Take 650 mg by mouth once as needed. For pain         . cyclobenzaprine (FLEXERIL) 10 MG tablet   Oral   Take 1 tablet (10 mg total) by mouth 3 (three) times daily as needed.   21 tablet   0   . HYDROcodone-acetaminophen (NORCO/VICODIN) 5-325 MG per tablet      Take one-two tabs po q 4-6 hrs prn pain   12  tablet   0   . predniSONE (DELTASONE) 10 MG tablet      Take 6 tablets day one, 5 tablets day two, 4 tablets day three, 3 tablets day four, 2 tablets day five, then 1 tablet day six   21 tablet   0    BP 95/66  Pulse 116  Temp(Src) 98.6 F (37 C) (Oral)  Resp 20  Ht 5\' 2"  (1.575 m)  Wt 115 lb (52.164 kg)  BMI 21.03 kg/m2  SpO2 99% Physical Exam  Nursing note and vitals reviewed. Constitutional: She is oriented to person, place, and time. She appears well-developed and well-nourished. No distress.  HENT:  Head:  Normocephalic and atraumatic.  Mouth/Throat: Oropharynx is clear and moist.  Eyes: EOM are normal. Pupils are equal, round, and reactive to light.  Neck: Phonation normal. Muscular tenderness present. No spinous process tenderness present. No rigidity. Decreased range of motion present. No erythema present. No Brudzinski's sign and no Kernig's sign noted. No thyromegaly present.    ttp of the right cervical spine and  paraspinal muscles.  Grip strength is strong and equal bilaterally.  Distal sensation intact,  CR < 2 sec.  Pt has full ROM of the right shoulder and arm.    Cardiovascular: Normal rate, regular rhythm, normal heart sounds and intact distal pulses.   No murmur heard. Pulmonary/Chest: Effort normal and breath sounds normal. No respiratory distress. She exhibits no tenderness.  Musculoskeletal: She exhibits tenderness. She exhibits no edema.       Cervical back: She exhibits tenderness. She exhibits normal range of motion, no bony tenderness, no swelling, no deformity, no spasm and normal pulse.  See neck exam  Lymphadenopathy:    She has no cervical adenopathy.  Neurological: She is alert and oriented to person, place, and time. No cranial nerve deficit or sensory deficit. She exhibits normal muscle tone. Coordination normal.  Reflex Scores:      Tricep reflexes are 2+ on the right side and 2+ on the left side.      Bicep reflexes are 2+ on the right side and 2+ on the left side.      Brachioradialis reflexes are 2+ on the right side and 2+ on the left side. Skin: Skin is warm and dry.    ED Course   Procedures (including critical care time)  Labs Reviewed - No data to display No results found. 1. Cervical radicular pain     MDM    Patient refused c-spine imaging.  States that her insurance has not been activated yet by her new employer.  States that she will f/u with her PMD.  No concerning sx's for emergent neurological or cardiac process.  Likely cervical  radiculopathy.   Pt has full ROM of the neck and right arm.  She agrees to close f/u and to return here if her sx's worsen.  VSS.  She appears stable for discharge.  Braedon Sjogren L. Trisha Mangle, PA-C 04/25/13 1943

## 2013-04-26 NOTE — ED Provider Notes (Signed)
Medical screening examination/treatment/procedure(s) were performed by non-physician practitioner and as supervising physician I was immediately available for consultation/collaboration.    Glorimar Stroope D Marlys Stegmaier, MD 04/26/13 0353 

## 2014-07-12 ENCOUNTER — Encounter (HOSPITAL_COMMUNITY): Payer: Self-pay | Admitting: Emergency Medicine

## 2014-11-14 ENCOUNTER — Emergency Department (HOSPITAL_COMMUNITY)
Admission: EM | Admit: 2014-11-14 | Discharge: 2014-11-14 | Disposition: A | Discharge status: Home / Self Care | Payer: Self-pay | Attending: Emergency Medicine | Admitting: Emergency Medicine

## 2014-11-14 ENCOUNTER — Encounter (HOSPITAL_COMMUNITY): Payer: Self-pay | Admitting: Emergency Medicine

## 2014-11-14 DIAGNOSIS — Z720 Tobacco use: Secondary | ICD-10-CM | POA: Insufficient documentation

## 2014-11-14 DIAGNOSIS — X58XXXA Exposure to other specified factors, initial encounter: Secondary | ICD-10-CM | POA: Insufficient documentation

## 2014-11-14 DIAGNOSIS — Y9389 Activity, other specified: Secondary | ICD-10-CM | POA: Insufficient documentation

## 2014-11-14 DIAGNOSIS — S39012A Strain of muscle, fascia and tendon of lower back, initial encounter: Secondary | ICD-10-CM | POA: Insufficient documentation

## 2014-11-14 DIAGNOSIS — Z792 Long term (current) use of antibiotics: Secondary | ICD-10-CM | POA: Insufficient documentation

## 2014-11-14 DIAGNOSIS — Y998 Other external cause status: Secondary | ICD-10-CM | POA: Insufficient documentation

## 2014-11-14 DIAGNOSIS — Y9289 Other specified places as the place of occurrence of the external cause: Secondary | ICD-10-CM | POA: Insufficient documentation

## 2014-11-14 DIAGNOSIS — Z7982 Long term (current) use of aspirin: Secondary | ICD-10-CM | POA: Insufficient documentation

## 2014-11-14 DIAGNOSIS — Z79899 Other long term (current) drug therapy: Secondary | ICD-10-CM | POA: Insufficient documentation

## 2014-11-14 MED ORDER — KETOROLAC TROMETHAMINE 30 MG/ML IJ SOLN
60.0000 mg | Freq: Once | INTRAMUSCULAR | Status: AC
  Administered 2014-11-14: 60 mg via INTRAMUSCULAR
  Filled 2014-11-14: qty 2

## 2014-11-14 MED ORDER — OXYCODONE-ACETAMINOPHEN 5-325 MG PO TABS: 1.0000 | ORAL_TABLET | ORAL | Status: DC | PRN

## 2014-11-14 MED ORDER — IBUPROFEN 800 MG PO TABS: 800.0000 mg | ORAL_TABLET | Freq: Three times a day (TID) | ORAL | Status: DC | PRN

## 2014-11-14 MED ORDER — MORPHINE SULFATE 4 MG/ML IJ SOLN
4.0000 mg | Freq: Once | INTRAMUSCULAR | Status: AC
  Administered 2014-11-14: 4 mg via INTRAMUSCULAR
  Filled 2014-11-14: qty 1

## 2014-11-14 MED ORDER — METHYLPREDNISOLONE (PAK) 4 MG PO TABS: ORAL_TABLET | ORAL | Status: DC

## 2014-11-14 MED ORDER — OXYCODONE-ACETAMINOPHEN 5-325 MG PO TABS
2.0000 | ORAL_TABLET | Freq: Once | ORAL | Status: AC
  Administered 2014-11-14: 2 via ORAL
  Filled 2014-11-14: qty 2

## 2014-11-14 NOTE — ED Notes (Signed)
Patient with no complaints at this time. Respirations even and unlabored. Skin warm/dry. Discharge instructions reviewed with patient at this time. Patient given opportunity to voice concerns/ask questions. Patient discharged at this time and left Emergency Department with steady gait.   

## 2014-11-14 NOTE — ED Notes (Signed)
Patient with c/o lower back pain after lifting bucket last night per patient. Describing sciatica pain that radiates down left leg.

## 2014-11-14 NOTE — ED Provider Notes (Signed)
This chart was scribed for Layla MawKristen N Ward, DO by Roxy Cedarhandni Bhalodia, ED Scribe. This patient was seen in room APA11/APA11 and the patient's care was started at 7:45 AM.   TIME SEEN: 7:45 AM   CHIEF COMPLAINT: Back Pain  HPI: Joanne Larson is a 54 y.o. female with no significant past medical history who presents to the Emergency Department complaining of moderate lower back pain that began last night after patient lifted a heavy bucket of sheet rock. She describes shooting pain to be radiating down left leg and states that it is difficult to put pressure to left leg when standing. Pain is exacerbated by movement and palpation. No alleviating factors. She denies associated numbness, tingling, bowel or bladder incontinence. She denies hx of similar symptoms in the past. No fever.  ROS: See HPI Constitutional: no fever  Eyes: no drainage  ENT: no runny nose   Cardiovascular:  no chest pain  Resp: no SOB  GI: no vomiting GU: no dysuria Integumentary: no rash  Allergy: no hives  Musculoskeletal: no leg swelling; lower back pain Neurological: no slurred speech ROS otherwise negative  PAST MEDICAL HISTORY/PAST SURGICAL HISTORY:  History reviewed. No pertinent past medical history.  MEDICATIONS:  Prior to Admission medications   Medication Sig Start Date End Date Taking? Authorizing Provider  amoxicillin (AMOXIL) 500 MG capsule Take 1 capsule (500 mg total) by mouth 3 (three) times daily. 09/22/12   Burgess AmorJulie Idol, PA-C  aspirin 325 MG tablet Take 650 mg by mouth once as needed. For pain    Historical Provider, MD  cyclobenzaprine (FLEXERIL) 10 MG tablet Take 1 tablet (10 mg total) by mouth 3 (three) times daily as needed. 04/23/13   Tammy L. Triplett, PA-C  HYDROcodone-acetaminophen (NORCO/VICODIN) 5-325 MG per tablet Take one-two tabs po q 4-6 hrs prn pain 04/23/13   Tammy L. Triplett, PA-C  predniSONE (DELTASONE) 10 MG tablet Take 6 tablets day one, 5 tablets day two, 4 tablets day three, 3  tablets day four, 2 tablets day five, then 1 tablet day six 04/23/13   Tammy L. Triplett, PA-C    ALLERGIES:  No Known Allergies  SOCIAL HISTORY:  History  Substance Use Topics  . Smoking status: Current Every Day Smoker -- 1.00 packs/day for 10 years    Types: Cigarettes  . Smokeless tobacco: Never Used  . Alcohol Use: No   FAMILY HISTORY: No family history on file.  EXAM: Triage Vitals: BP 134/74 mmHg  Pulse 90  Temp(Src) 97.8 F (36.6 C) (Oral)  Resp 18  Ht 5' 2.5" (1.588 m)  Wt 115 lb (52.164 kg)  BMI 20.69 kg/m2  SpO2 96%  CONSTITUTIONAL: Alert and oriented and responds appropriately to questions. Well-appearing; well-nourished, appears uncomfortable but is not in distress HEAD: Normocephalic EYES: Conjunctivae clear, PERRL ENT: normal nose; no rhinorrhea; moist mucous membranes; pharynx without lesions noted NECK: Supple, no meningismus, no LAD  CARD: RRR; S1 and S2 appreciated; no murmurs, no clicks, no rubs, no gallops RESP: Normal chest excursion without splinting or tachypnea; breath sounds clear and equal bilaterally; no wheezes, no rhonchi, no rales ABD/GI: Normal bowel sounds; non-distended; soft, non-tender, no rebound, no guarding BACK:  The back appears normal and is tender to palpation over left SI joint. There is no CVA tenderness; no midline spine tenderness, or step off or deformity. EXT: Normal ROM in all joints; non-tender to palpation; no edema; normal capillary refill; no cyanosis    SKIN: Normal color for age and race; warm  NEURO: Moves all extremities equally; sensation to touch intact 2+ DP; no clonus PSYCH: The patient's mood and manner are appropriate. Grooming and personal hygiene are appropriate.  MEDICAL DECISION MAKING: Patient here with left side pain with pain that radiates down her leg. Neurologically intact. Pain improved after Toradol and morphine. Patient has been able to ambulate. We'll discharge with Percocet, ibuprofen. Have advised  her to follow-up with her PCP. I do not feel she needs imaging of her back at this time. Discussed return precautions. She verbalized understanding and is comfortable with plan.    Layla Maw Ward, DO 11/14/14 [phone]

## 2014-11-14 NOTE — Discharge Instructions (Signed)

## 2014-11-14 NOTE — ED Notes (Signed)
Patient ambulated unassisted with steady gate in hallway and returned to room. Pt. Complains of pain with walking.

## 2014-11-16 ENCOUNTER — Emergency Department (HOSPITAL_COMMUNITY)
Admission: EM | Admit: 2014-11-16 | Discharge: 2014-11-16 | Disposition: A | Discharge status: Home / Self Care | Payer: Self-pay | Attending: Emergency Medicine | Admitting: Emergency Medicine

## 2014-11-16 ENCOUNTER — Encounter (HOSPITAL_COMMUNITY): Payer: Self-pay | Admitting: Emergency Medicine

## 2014-11-16 DIAGNOSIS — Z7982 Long term (current) use of aspirin: Secondary | ICD-10-CM | POA: Insufficient documentation

## 2014-11-16 DIAGNOSIS — Z791 Long term (current) use of non-steroidal anti-inflammatories (NSAID): Secondary | ICD-10-CM | POA: Insufficient documentation

## 2014-11-16 DIAGNOSIS — G8929 Other chronic pain: Secondary | ICD-10-CM | POA: Insufficient documentation

## 2014-11-16 DIAGNOSIS — R05 Cough: Secondary | ICD-10-CM | POA: Insufficient documentation

## 2014-11-16 DIAGNOSIS — Z90710 Acquired absence of both cervix and uterus: Secondary | ICD-10-CM | POA: Insufficient documentation

## 2014-11-16 DIAGNOSIS — Z9889 Other specified postprocedural states: Secondary | ICD-10-CM | POA: Insufficient documentation

## 2014-11-16 DIAGNOSIS — Z720 Tobacco use: Secondary | ICD-10-CM | POA: Insufficient documentation

## 2014-11-16 DIAGNOSIS — M549 Dorsalgia, unspecified: Secondary | ICD-10-CM

## 2014-11-16 MED ORDER — MORPHINE SULFATE 4 MG/ML IJ SOLN
8.0000 mg | Freq: Once | INTRAMUSCULAR | Status: AC
  Administered 2014-11-16: 8 mg via INTRAMUSCULAR
  Filled 2014-11-16: qty 2

## 2014-11-16 MED ORDER — PROMETHAZINE HCL 12.5 MG PO TABS
12.5000 mg | ORAL_TABLET | Freq: Once | ORAL | Status: AC
  Administered 2014-11-16: 12.5 mg via ORAL
  Filled 2014-11-16: qty 1

## 2014-11-16 NOTE — Discharge Instructions (Signed)
I reviewed your previous emergency department visit, please see Dr. Lysbeth GalasNyland for additional evaluation and for referrals concerning your back. You were treated tonight with a narcotic injection, please use caution getting around. Back Pain, Adult Back pain is very common. The pain often gets better over time. The cause of back pain is usually not dangerous. Most people can learn to manage their back pain on their own.  HOME CARE   Stay active. Start with short walks on flat ground if you can. Try to walk farther each day.  Do not sit, drive, or stand in one place for more than 30 minutes. Do not stay in bed.  Do not avoid exercise or work. Activity can help your back heal faster.  Be careful when you bend or lift an object. Bend at your knees, keep the object close to you, and do not twist.  Sleep on a firm mattress. Lie on your side, and bend your knees. If you lie on your back, put a pillow under your knees.  Only take medicines as told by your doctor.  Put ice on the injured area.  Put ice in a plastic bag.  Place a towel between your skin and the bag.  Leave the ice on for 15-20 minutes, 03-04 times a day for the first 2 to 3 days. After that, you can switch between ice and heat packs.  Ask your doctor about back exercises or massage.  Avoid feeling anxious or stressed. Find good ways to deal with stress, such as exercise. GET HELP RIGHT AWAY IF:   Your pain does not go away with rest or medicine.  Your pain does not go away in 1 week.  You have new problems.  You do not feel well.  The pain spreads into your legs.  You cannot control when you poop (bowel movement) or pee (urinate).  Your arms or legs feel weak or lose feeling (numbness).  You feel sick to your stomach (nauseous) or throw up (vomit).  You have belly (abdominal) pain.  You feel like you may pass out (faint). MAKE SURE YOU:   Understand these instructions.  Will watch your condition.  Will get  help right away if you are not doing well or get worse. Document Released: 02/13/2008 Document Revised: 11/19/2011 Document Reviewed: 12/29/2013 [facility] Patient Information 2015 [employer] [place]. This information is not intended to replace advice given to you by your health care provider. Make sure you discuss any questions you have with your health care provider.

## 2014-11-16 NOTE — ED Notes (Signed)
Pt seen here 3/6 for back pain, was told to come back if symptoms did not improve. Pt reports she cannot sleep, is having pain down her leg and is unable to get into a comfortable position.

## 2014-11-16 NOTE — ED Provider Notes (Signed)
CSN: [id number]     Arrival date & time 11/16/14  1340 History   First MD Initiated Contact with Patient 11/16/14 1537     Chief Complaint  Patient presents with  . Back Pain     (Consider location/radiation/quality/duration/timing/severity/associated sxs/prior Treatment) HPI Comments: Pt is a 54 y/o female who presents to the ED with c/o back pain. She lifted a heavy bucket of carpenter's mud and injured the lower back March 5. Pt was seen in ED on March 6. No gross neuro deficit noted. Pt was treated in ED and given Rx to take home. She was instructed to follow up with Dr Lysbeth GalasNyland. She reports she has not followed up with Dr Lysbeth GalasNyland. She continues to have pain and has used the pain meds given already. She presents to the ED for assistance with pain management. No loss of bowel or bladder  Function. No loss of lower extremity function. Pt states the pain seem to be going down her leg and at times interferes  With her rest.  Patient is a 54 y.o. female presenting with back pain. The history is provided by the patient.  Back Pain Location:  Lumbar spine Associated symptoms: no abdominal pain, no chest pain and no dysuria     History reviewed. No pertinent past medical history. Past Surgical History  Procedure Laterality Date  . Abdominal hysterectomy    . Cesarean section     Family History  Problem Relation Age of Onset  . Diabetes Mother    History  Substance Use Topics  . Smoking status: Current Every Day Smoker -- 1.00 packs/day for 10 years    Types: Cigarettes  . Smokeless tobacco: Never Used  . Alcohol Use: No   OB History    Gravida Para Term Preterm AB TAB SAB Ectopic Multiple Living   4 2 2  2  2   2      Review of Systems  Constitutional: Negative for activity change.       All ROS Neg except as noted in HPI  HENT: Negative for nosebleeds.   Eyes: Negative for photophobia and discharge.  Respiratory: Positive for cough. Negative for shortness of breath and wheezing.    Cardiovascular: Negative for chest pain and palpitations.  Gastrointestinal: Negative for abdominal pain and blood in stool.  Genitourinary: Negative for dysuria, frequency and hematuria.  Musculoskeletal: Positive for back pain. Negative for arthralgias and neck pain.  Skin: Negative.   Neurological: Negative for dizziness, seizures and speech difficulty.  Psychiatric/Behavioral: Negative for hallucinations and confusion.      Allergies  Review of patient's allergies indicates no known allergies.  Home Medications   Prior to Admission medications   Medication Sig Start Date End Date Taking? Authorizing Provider  amoxicillin (AMOXIL) 500 MG capsule Take 1 capsule (500 mg total) by mouth 3 (three) times daily. 09/22/12   Burgess AmorJulie Idol, PA-C  aspirin 325 MG tablet Take 650 mg by mouth once as needed. For pain    Historical Provider, MD  cyclobenzaprine (FLEXERIL) 10 MG tablet Take 1 tablet (10 mg total) by mouth 3 (three) times daily as needed. 04/23/13   Tammi Triplett, PA-C  HYDROcodone-acetaminophen (NORCO/VICODIN) 5-325 MG per tablet Take one-two tabs po q 4-6 hrs prn pain 04/23/13   Tammi Triplett, PA-C  ibuprofen (ADVIL,MOTRIN) 800 MG tablet Take 1 tablet (800 mg total) by mouth every 8 (eight) hours as needed for mild pain. 11/14/14   Kristen N Ward, DO  methylPREDNIsolone (MEDROL DOSPACK) 4 MG  tablet follow package directions 11/14/14   Layla Maw Ward, DO  oxyCODONE-acetaminophen (PERCOCET/ROXICET) 5-325 MG per tablet Take 1 tablet by mouth every 4 (four) hours as needed. 11/14/14   Kristen N Ward, DO  predniSONE (DELTASONE) 10 MG tablet Take 6 tablets day one, 5 tablets day two, 4 tablets day three, 3 tablets day four, 2 tablets day five, then 1 tablet day six 04/23/13   Tammi Triplett, PA-C   BP 159/97 mmHg  Pulse 79  Temp(Src) 98 F (36.7 C) (Oral)  Resp 20  Ht 5' 2.5" (1.588 m)  Wt 115 lb (52.164 kg)  BMI 20.69 kg/m2  SpO2 95% Physical Exam  Constitutional: She is oriented to  person, place, and time. She appears well-developed and well-nourished.  Non-toxic appearance.  HENT:  Head: Normocephalic.  Right Ear: Tympanic membrane and external ear normal.  Left Ear: Tympanic membrane and external ear normal.  Eyes: EOM and lids are normal. Pupils are equal, round, and reactive to light.  Neck: Normal range of motion. Neck supple. Carotid bruit is not present.  Cardiovascular: Normal rate, regular rhythm, normal heart sounds, intact distal pulses and normal pulses.   Pulmonary/Chest: Breath sounds normal. No respiratory distress.  Abdominal: Soft. Bowel sounds are normal. There is no tenderness. There is no guarding.  Musculoskeletal: Normal range of motion.  Left lower back pain. Lower back pain with 30 degree SLR on the left.  Lymphadenopathy:       Head (right side): No submandibular adenopathy present.       Head (left side): No submandibular adenopathy present.    She has no cervical adenopathy.  Neurological: She is alert and oriented to person, place, and time. She has normal strength. No cranial nerve deficit or sensory deficit.  No motor or sensory deficits.  Skin: Skin is warm and dry.  Psychiatric: She has a normal mood and affect. Her speech is normal.  Nursing note and vitals reviewed.   ED Course  Procedures (including critical care time) Labs Review Labs Reviewed - No data to display  Imaging Review No results found.   EKG Interpretation None      MDM   Gait steady. No neuro deficit noted. Pt treated in ED with IM morphine. Review of patient's chart reveals she received prescription for Percocet on March 6. Patient is requesting additional medications. I have asked that she see her primary physician for additional assistance with pain management and for additional referrals concerning her back.    Final diagnoses:  Chronic back pain    *I have reviewed nursing notes, vital signs, and all appropriate lab and imaging results for this  patient.**    Ivery Quale, PA-C 11/16/14 2046  Gerhard Munch, MD 11/17/14 [phone]

## 2014-11-19 ENCOUNTER — Emergency Department (HOSPITAL_COMMUNITY)
Admission: EM | Admit: 2014-11-19 | Discharge: 2014-11-19 | Disposition: A | Discharge status: Home / Self Care | Payer: Self-pay | Attending: Emergency Medicine | Admitting: Emergency Medicine

## 2014-11-19 ENCOUNTER — Encounter (HOSPITAL_COMMUNITY): Payer: Self-pay | Admitting: *Deleted

## 2014-11-19 DIAGNOSIS — M791 Myalgia: Secondary | ICD-10-CM | POA: Insufficient documentation

## 2014-11-19 DIAGNOSIS — M545 Low back pain: Secondary | ICD-10-CM | POA: Insufficient documentation

## 2014-11-19 DIAGNOSIS — Z720 Tobacco use: Secondary | ICD-10-CM | POA: Insufficient documentation

## 2014-11-19 DIAGNOSIS — R531 Weakness: Secondary | ICD-10-CM | POA: Insufficient documentation

## 2014-11-19 NOTE — Discharge Instructions (Signed)

## 2014-11-19 NOTE — ED Notes (Signed)
Pt states lower back pain, radiating down left leg. Unable to see PCP until Thursday.

## 2014-11-19 NOTE — ED Notes (Addendum)
Lt lower back pain  With radiation down lt leg .  Seen here for same. , says she feels she is getting worse  . ONset  Of pain after lifting heavy basket of laundry.

## 2014-11-19 NOTE — ED Provider Notes (Signed)
CSN: [id number]     Arrival date & time 11/19/14  1437 History  This chart was scribed for Zadie Rhineonald Ambera Fedele, MD by Abel PrestoKara Demonbreun, ED Scribe. This patient was seen in room [facility] and the patient's care was started at 3:07 PM.    Chief Complaint  Patient presents with  . Back Pain     Patient is a 54 y.o. female presenting with back pain. The history is provided by the patient. No language interpreter was used.  Back Pain Location:  Lumbar spine Quality:  Aching and burning Radiates to:  L thigh and L posterior upper leg Pain severity:  Moderate Duration:  2 days Timing:  Constant Progression:  Worsening Context: recent injury   Relieved by:  Nothing Ineffective treatments:  Narcotics Associated symptoms: leg pain and weakness   Associated symptoms: no abdominal pain, no bladder incontinence, no bowel incontinence, no chest pain, no dysuria and no numbness   Risk factors: no hx of cancer and no recent surgery    HPI Comments: Joanne Larson is a 54 y.o. female who presents to the Emergency Department complaining of low back pain with onset 2 days ago. Pt sates she was carrying a heavy bag in grocery store at onset and heard a pop in her back. Pt notes pain radiates down her left leg. Pt notes associated swelling in leg and weakness. Pt able to ambulate with difficulty. Pt was seen in ED on 11/17/14 for same and treated for muscle strain, states pain is worsening. Pt denies chest pain, abdominal pain, dysuria, fever, vomiting, and urinary and bowel incontinence. Pt has a f/u with Dr. Lysbeth GalasNyland in the next week.  Denies h/o cancer No h/o diabetes  PMH - none  Past Surgical History  Procedure Laterality Date  . Abdominal hysterectomy    . Cesarean section     Family History  Problem Relation Age of Onset  . Diabetes Mother    History  Substance Use Topics  . Smoking status: Current Every Day Smoker -- 1.00 packs/day for 10 years    Types: Cigarettes  . Smokeless tobacco:  Never Used  . Alcohol Use: No   OB History    Gravida Para Term Preterm AB TAB SAB Ectopic Multiple Living   4 2 2  2  2   2      Review of Systems  Cardiovascular: Negative for chest pain.  Gastrointestinal: Negative for abdominal pain and bowel incontinence.  Genitourinary: Negative for bladder incontinence and dysuria.  Musculoskeletal: Positive for myalgias and back pain.  Neurological: Positive for weakness. Negative for numbness.  All other systems reviewed and are negative.     Allergies  Review of patient's allergies indicates no known allergies.  Home Medications   Prior to Admission medications   Not on File   BP 124/96 mmHg  Pulse 85  Temp(Src) 98.7 F (37.1 C) (Oral)  Resp 16  Ht 5\' 2"  (1.575 m)  Wt 115 lb (52.164 kg)  BMI 21.03 kg/m2  SpO2 98% Physical Exam  Nursing note and vitals reviewed.   CONSTITUTIONAL: Well developed/well nourished HEAD: Normocephalic/atraumatic EYES: EOMI/PERRL ENMT: Mucous membranes moist NECK: supple no meningeal signs SPINE/BACK:entire spine nontender; sacral tenderness noted; No bruising/crepitance/stepoffs noted to spine  CV: S1/S2 noted, no murmurs/rubs/gallops noted LUNGS: Lungs are clear to auscultation bilaterally, no apparent distress ABDOMEN: soft, nontender, no rebound or guarding GU:no cva tenderness NEURO: Awake/alert, no saddle anesthesia, rectal tone present (chaperone present), equal motor 5/5 strength noted with the following:  hip flexion/knee flexion/extension, foot dorsi/plantar flexion, great toe extension intact bilaterally, no clonus bilaterally, plantar reflex appropriate (toes downgoing), no sensory deficit in any dermatome.  Equal patellar/achilles reflex noted (2+) in bilateral lower extremities.  Pt is able to ambulate unassisted. EXTREMITIES: pulses normal, full ROM, no Le edema SKIN: warm, color normal PSYCH: no abnormalities of mood noted, alert and oriented to situation    ED Course   Procedures  DIAGNOSTIC STUDIES: Oxygen Saturation is 98% on room air, normal by my interpretation.    COORDINATION OF CARE: 3:13 PM Discussed treatment plan with patient at beside, the patient agrees with the plan and has no further questions at this time. Given repeat visit, offered lumbar xray and to check urinalysis She refused and will see her PCP for this testing Offered Ibuprofen for pain.  She refused and requested d/c home   MDM   Final diagnoses:  Low back pain, unspecified back pain laterality, with sciatica presence unspecified    Nursing notes including past medical history and social history reviewed and considered in documentation Previous records reviewed and considered Narcotic database reviewed and considered in decision making   I personally performed the services described in this documentation, which was scribed in my presence. The recorded information has been reviewed and is accurate.      Zadie Rhine, MD 11/19/14 1524

## 2016-11-03 ENCOUNTER — Emergency Department (HOSPITAL_COMMUNITY)
Admission: EM | Admit: 2016-11-03 | Discharge: 2016-11-03 | Disposition: A | Discharge status: Home / Self Care | Payer: BLUE CROSS/BLUE SHIELD | Attending: Emergency Medicine | Admitting: Emergency Medicine

## 2016-11-03 ENCOUNTER — Encounter (HOSPITAL_COMMUNITY): Payer: Self-pay | Admitting: Emergency Medicine

## 2016-11-03 ENCOUNTER — Emergency Department (HOSPITAL_COMMUNITY): Payer: BLUE CROSS/BLUE SHIELD

## 2016-11-03 DIAGNOSIS — M778 Other enthesopathies, not elsewhere classified: Secondary | ICD-10-CM

## 2016-11-03 DIAGNOSIS — F17210 Nicotine dependence, cigarettes, uncomplicated: Secondary | ICD-10-CM | POA: Insufficient documentation

## 2016-11-03 DIAGNOSIS — M79645 Pain in left finger(s): Secondary | ICD-10-CM | POA: Diagnosis present

## 2016-11-03 DIAGNOSIS — M779 Enthesopathy, unspecified: Secondary | ICD-10-CM | POA: Insufficient documentation

## 2016-11-03 MED ORDER — DICLOFENAC SODIUM 75 MG PO TBEC: 75.0000 mg | DELAYED_RELEASE_TABLET | Freq: Two times a day (BID) | ORAL | 0 refills | Status: DC

## 2016-11-03 NOTE — ED Triage Notes (Signed)
Pt c/o LT hand (primarily thumb and middle finger) and wrist pain x 2 weeks that has progressively gotten worse. Denies any injury.

## 2016-11-03 NOTE — ED Provider Notes (Addendum)
[facility] DEPT Provider Note   CSN: [id number] Arrival date & time: 11/03/16  [id number]     History   Chief Complaint Chief Complaint  Patient presents with  . Hand Pain    HPI Joanne Larson is a 56 y.o. female presenting with a 2 week history of worsening pain with movement along her left thumb extending into the wrist.  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  She is right handed.  She has applied ice and taken tylenol and ibuprofen without relief. She denies weakness but pain has caused her to drop and break plates at home while doing dishes.Marland Kitchen.  HPI  History reviewed. No pertinent past medical history.  There are no active problems to display for this patient.   Past Surgical History:  Procedure Laterality Date  . ABDOMINAL HYSTERECTOMY    . CESAREAN SECTION      OB History    Gravida Para Term Preterm AB Living   4 2 2   2 2    SAB TAB Ectopic Multiple Live Births   2               Home Medications    Prior to Admission medications   Medication Sig Start Date End Date Taking? Authorizing Provider  diclofenac (VOLTAREN) 75 MG EC tablet Take 1 tablet (75 mg total) by mouth 2 (two) times daily. 11/03/16   Burgess AmorJulie Tori Cupps, PA-C    Family History Family History  Problem Relation Age of Onset  . Diabetes Mother     Social History Social History  Substance Use Topics  . Smoking status: Current Every Day Smoker    Packs/day: 1.00    Years: 20.00    Types: Cigarettes  . Smokeless tobacco: Never Used  . Alcohol use No     Allergies   Patient has no known allergies.   Review of Systems Review of Systems  Constitutional: Negative for fever.  Musculoskeletal: Positive for arthralgias. Negative for joint swelling and myalgias.  Neurological: Negative for weakness and numbness.     Physical Exam Updated Vital Signs BP 127/75 (BP Location: Left Arm)   Pulse 100   Temp 98.2 F (36.8 C) (Oral)   Resp 18    Ht 5\' 2"  (1.575 m)   Wt 48.1 kg   SpO2 90%   BMI 19.39 kg/m   Physical Exam  Constitutional: She appears well-developed and well-nourished.  HENT:  Head: Atraumatic.  Neck: Normal range of motion.  Cardiovascular:  Pulses equal bilaterally  Musculoskeletal: She exhibits tenderness.       Left hand: She exhibits tenderness. She exhibits normal two-point discrimination and no swelling. Normal sensation noted. Normal strength noted.  ttp along left lateral thumb radiating into the proximal forearm.  No crepitus with ROM, no appreciable edema, no erythema.  Less than 2 sec cap refill in all fingertips.  Sensation intact.    Neurological: She is alert. She has normal strength. She displays normal reflexes. No sensory deficit.  Skin: Skin is warm and dry.  Psychiatric: She has a normal mood and affect.     ED Treatments / Results  Labs (all labs ordered are listed, but only abnormal results are displayed) Labs Reviewed - No data to display  EKG  EKG Interpretation None       Radiology Dg Hand Complete Left  Result Date: 11/03/2016 CLINICAL DATA:  Left hand pain for 2 weeks. No known injury. Initial encounter. EXAM: LEFT HAND -  COMPLETE 3+ VIEW COMPARISON:  12/11/2011 radiographs FINDINGS: There is no evidence of fracture or dislocation. There is no evidence of arthropathy or other focal bone abnormality. Soft tissues are unremarkable. IMPRESSION: Negative. Electronically Signed   By: Harmon Pier M.D.   On: 11/03/2016 07:35    Procedures Procedures (including critical care time)  Medications Ordered in ED Medications - No data to display   Initial Impression / Assessment and Plan / ED Course  I have reviewed the triage vital signs and the nursing notes.  Pertinent labs & imaging results that were available during my care of the patient were reviewed by me and considered in my medical decision making (see chart for details).     Imaging reviewed, exam suggesting thumb  extensor tendinitis. Placed in thumb spica velcro splint by RN, heat, diclofenac, referral to hand prn if sx persist or worsen.  Final Clinical Impressions(s) / ED Diagnoses   Final diagnoses:  Tendinitis of thumb    New Prescriptions New Prescriptions   DICLOFENAC (VOLTAREN) 75 MG EC TABLET    Take 1 tablet (75 mg total) by mouth 2 (two) times daily.     Burgess Amor, PA-C 11/03/16 1610    Bethann Berkshire, MD 11/03/16 1503    Burgess Amor, PA-C 11/13/16 1453    Bethann Berkshire, MD 11/16/16 [phone]

## 2016-11-03 NOTE — ED Notes (Signed)
Patient transported to X-ray 

## 2017-09-19 IMAGING — DX DG HAND COMPLETE 3+V*L*
3 series · 3 of 3 positions shown · non-contrast
Comparison: 12/11/2011 radiographs

CLINICAL DATA: Left hand pain for 2 weeks. No known injury. Initial
encounter.

EXAM:
LEFT HAND - COMPLETE 3+ VIEW

[hand pa]
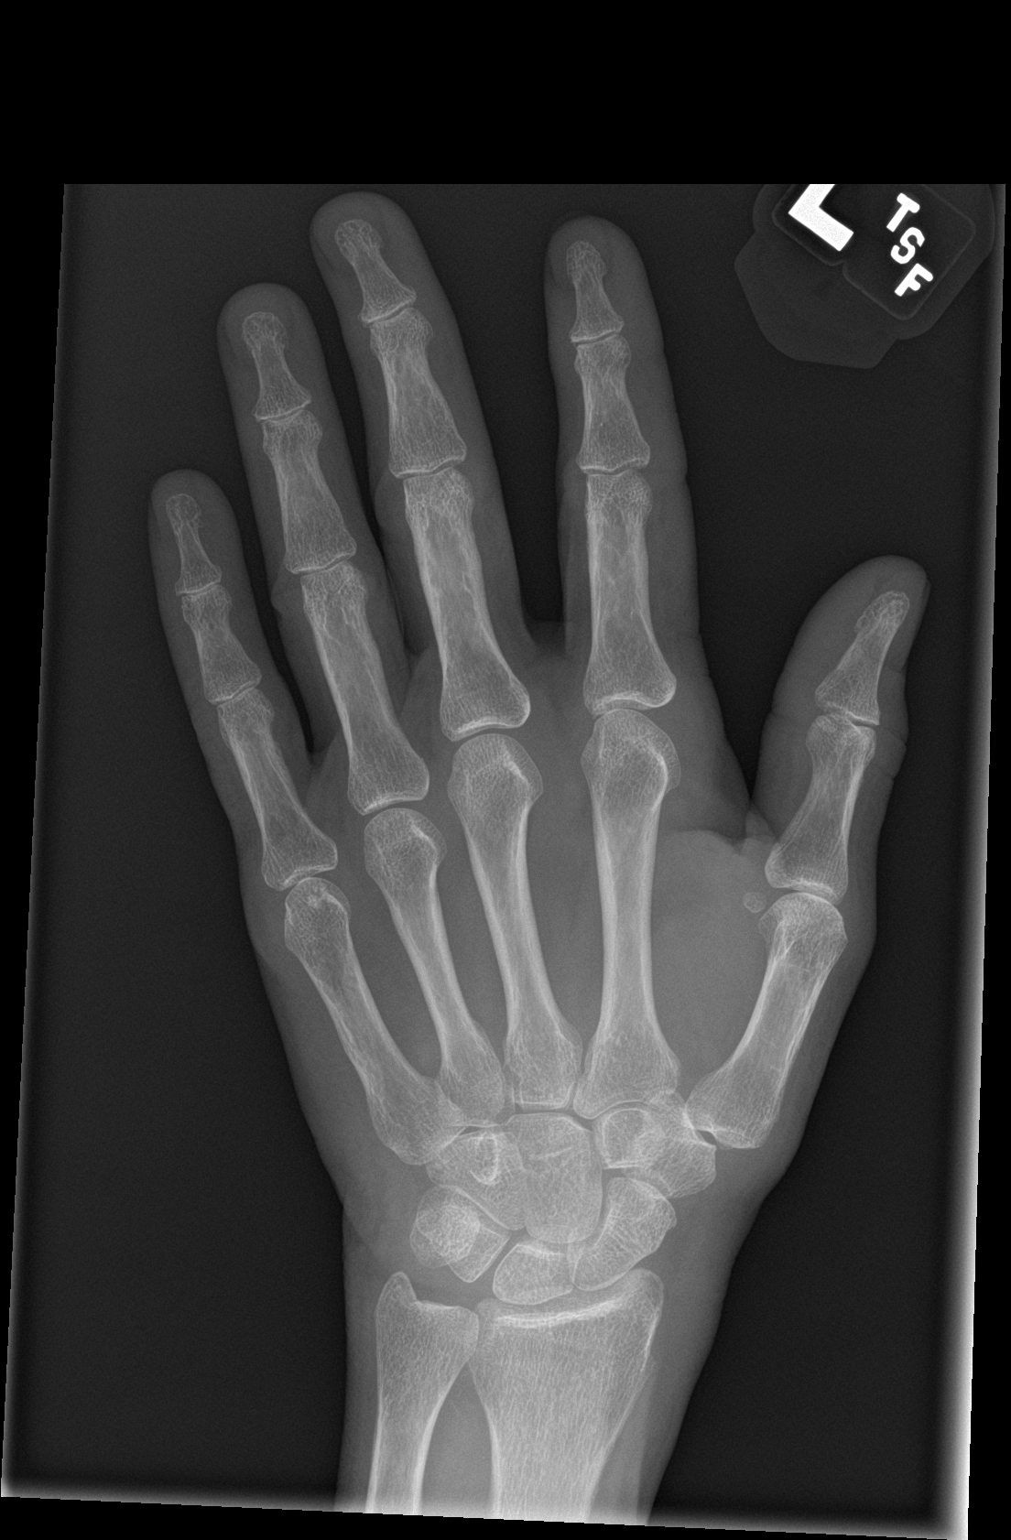

[hand obl]
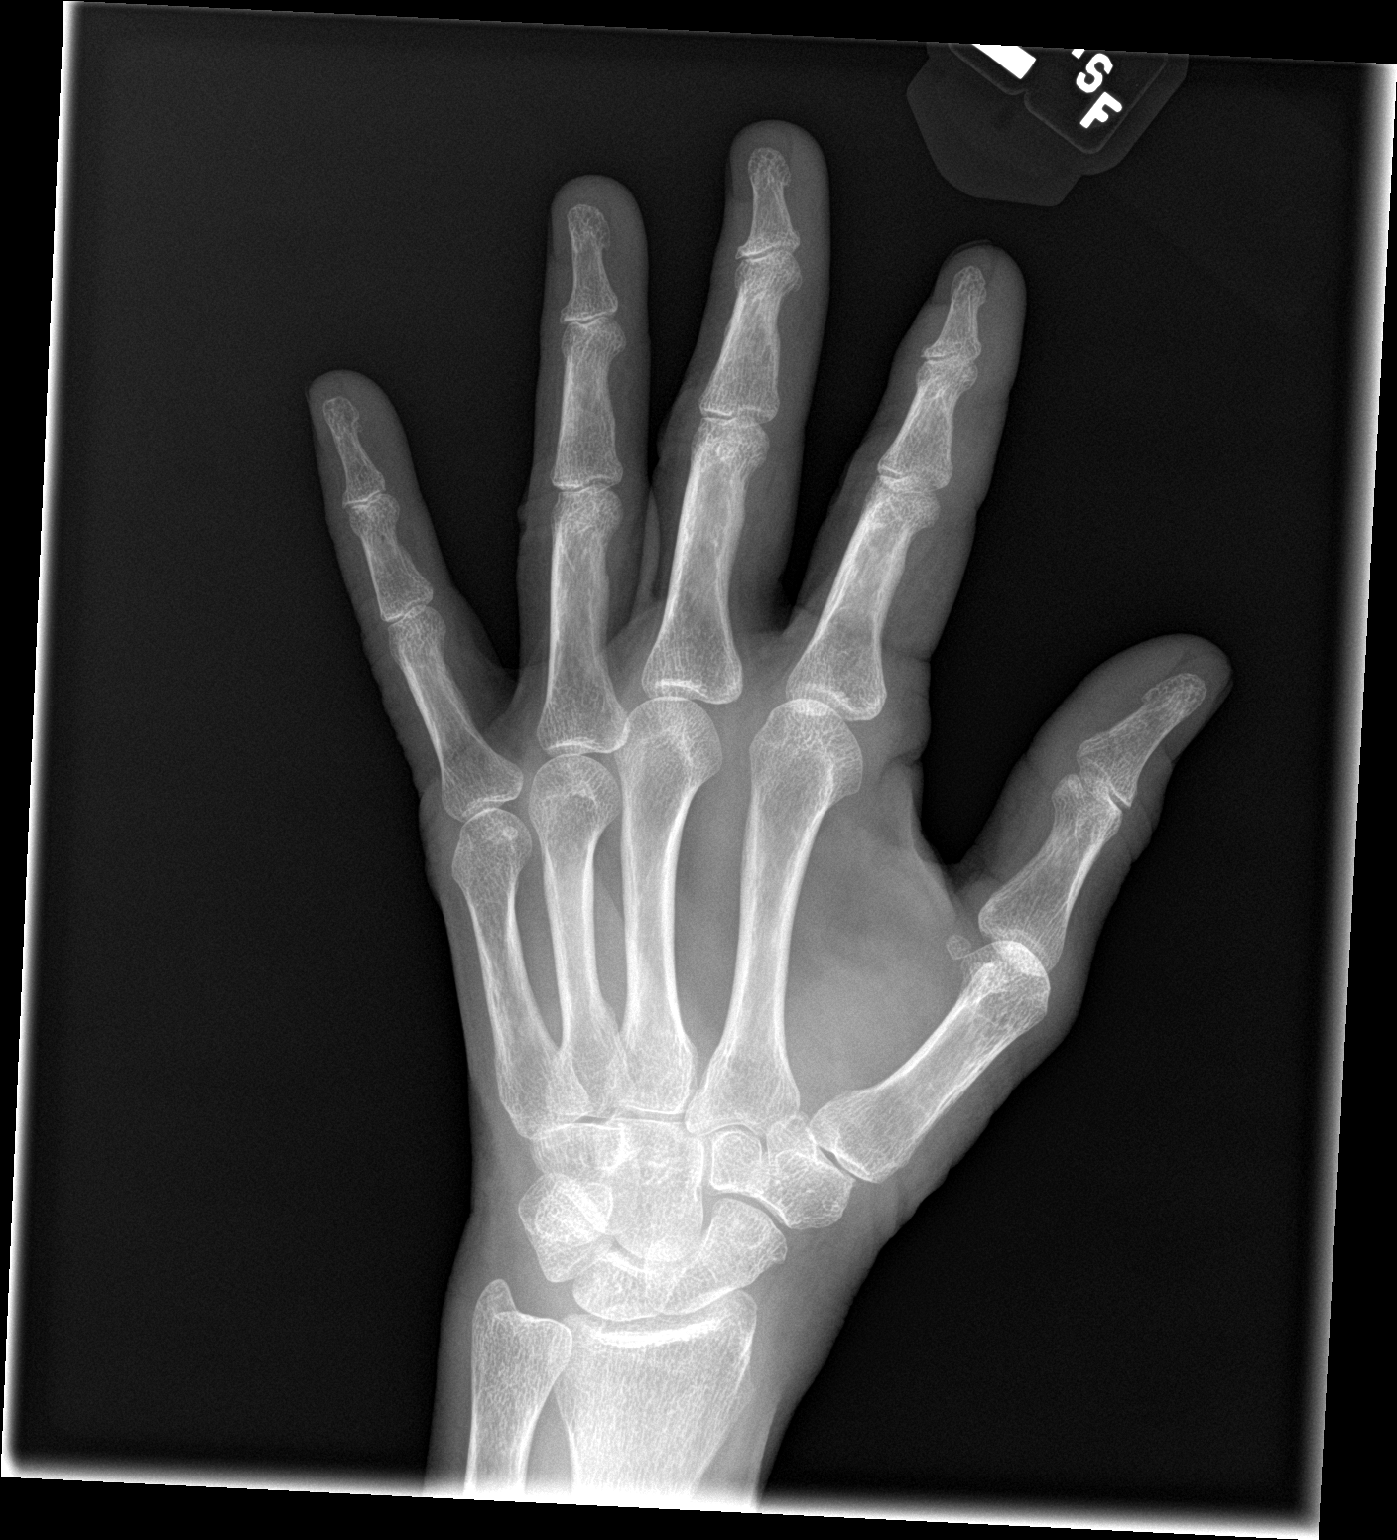

[hand lat]
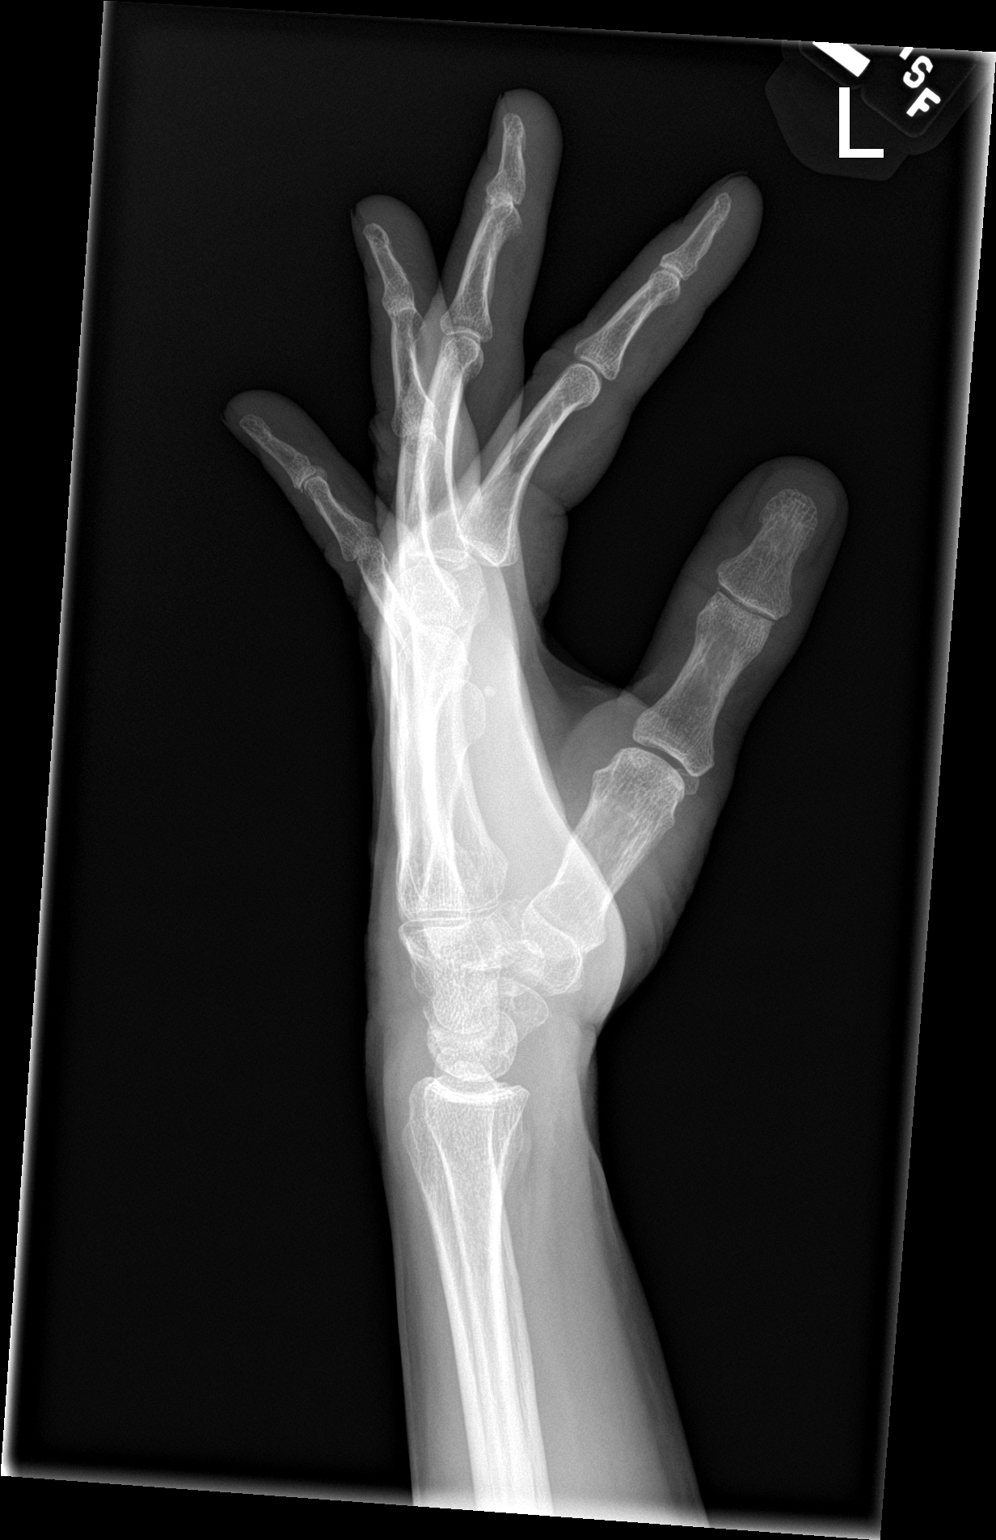

[3 of 3 positions shown; findings below may reference images not displayed]

FINDINGS: There is no evidence of fracture or dislocation. There is no
evidence of arthropathy or other focal bone abnormality. Soft
tissues are unremarkable.
IMPRESSION: Negative.

## 2021-05-16 DIAGNOSIS — J9601 Acute respiratory failure with hypoxia: Secondary | ICD-10-CM | POA: Insufficient documentation

## 2021-05-18 DIAGNOSIS — J9621 Acute and chronic respiratory failure with hypoxia: Secondary | ICD-10-CM | POA: Insufficient documentation

## 2021-05-18 DIAGNOSIS — I272 Pulmonary hypertension, unspecified: Secondary | ICD-10-CM | POA: Insufficient documentation

## 2021-05-18 DIAGNOSIS — Z720 Tobacco use: Secondary | ICD-10-CM | POA: Insufficient documentation

## 2022-12-26 NOTE — Progress Notes (Deleted)
   Joanne Larson, female    DOB: 06-29-1961    MRN: [id number]   Brief patient profile:  62  yo*** *** referred to pulmonary clinic in [place]  12/27/2022 by *** for ***      History of Present Illness  12/27/2022  Pulmonary/ 1st office eval/ [facility] / [facility]  No chief complaint on file.    Dyspnea:  *** Cough: *** Sleep: *** SABA use: *** 02: *** Lung cancer screen: ***  No past medical history on file.  Outpatient Medications Prior to Visit  Medication Sig Dispense Refill   diclofenac (VOLTAREN) 75 MG EC tablet Take 1 tablet (75 mg total) by mouth 2 (two) times daily. 20 tablet 0   No facility-administered medications prior to visit.     Objective:     There were no vitals taken for this visit.         Assessment   No problem-specific Assessment & Plan notes found for this encounter.     Sandrea Hughs, MD 12/26/2022

## 2022-12-27 ENCOUNTER — Institutional Professional Consult (permissible substitution): Payer: BLUE CROSS/BLUE SHIELD | Admitting: Internal Medicine

## 2023-02-25 NOTE — Progress Notes (Signed)
Joanne Larson, female    DOB: June 08, 1961    MRN: [id number]   Brief patient profile:  89  yowf quit smoking 06/2022/MM with GOLD 4 copd criteria  06/21/21 and hypercarbic/hypoxemic RF at that point self - referred to pulmonary clinic in [facility]  02/26/2023  for copd   Onset of doe x 11/2020 >  seen by  Dr Vergie Living  in [facility] sept 2022  "for disability eval"  > instead  admitted to [facility] and d/c'd on 6 lpm and stiolto which helped some until it ran out    History of Present Illness  02/26/2023  Pulmonary/ 1st office eval/ Joanne Larson / [facility] on no maint rx/ using 02 prn  Chief Complaint  Patient presents with   Consult  Dyspnea:  still able to do cooking/ some cleaning  Gets let off at curb/ @ [employer]  Cough: smoker's rattle > no mucus  Sleep: level bed  2 pillows  SABA use: once or twice daily at most, just hfa though has neb, doe not have solution  02: home conc = 6lpm  No obvious day to day or daytime pattern/variability or assoc excess/ purulent sputum or mucus plugs or hemoptysis or cp or chest tightness, subjective wheeze or overt   hb symptoms.   Sleeping  without nocturnal  or early am exacerbation  of respiratory  c/o's or need for noct saba. Also denies any obvious fluctuation of symptoms with weather or environmental changes or other aggravating or alleviating factors except as outlined above   No unusual exposure hx or h/o childhood pna/ asthma or knowledge of premature birth.  Current Allergies, Complete Past Medical History, Past Surgical History, Family History, and Social History were reviewed in [employer] record.  ROS  The following are not active complaints unless bolded Hoarseness, sore throat, dysphagia, dental problems, itching, sneezing,  nasal congestion or discharge of excess mucus or purulent secretions, ear ache,   fever, chills, sweats, unintended wt loss or wt gain, classically pleuritic or  exertional cp,  orthopnea pnd or arm/hand swelling  or leg swelling, presyncope, palpitations, abdominal pain, anorexia, nausea, vomiting, diarrhea  or change in bowel habits or change in bladder habits, change in stools or change in urine, dysuria, hematuria,  rash, arthralgias, visual complaints, headache, numbness, weakness or ataxia or problems with walking or coordination,  change in mood or  memory. Hyperkinetic runs in fm choreoathetoid-like             No past medical history on file.  Outpatient Medications Prior to Visit  Medication Sig Dispense Refill   Albuterol Sulfate, sensor, (PROAIR DIGIHALER) 108 (90 Base) MCG/ACT AEPB Inhale into the lungs.     diclofenac (VOLTAREN) 75 MG EC tablet Take 1 tablet (75 mg total) by mouth 2 (two) times daily. 20 tablet 0   STIOLTO RESPIMAT 2.5-2.5 MCG/ACT AERS Inhale into the lungs.     No facility-administered medications prior to visit.     Objective:     BP (!) 108/56   Pulse 97   Ht 5\' 3"  (1.6 m)   Wt 104 lb (47.2 kg)   SpO2 90% Comment: 10L cont,  BMI 18.42 kg/m   SpO2: 90 % (10L cont,)  Chronically ill thin w/c bound  wf constant arm and leg movements ? Choreoathetoid?  HEENT :  Oropharynx  clear     NECK :  without JVD/Nodes/TM/ nl carotid upstrokes bilaterally   LUNGS: no acc  muscle use,  Mod barrel  contour chest wall with bilateral  Distant bs s audible wheeze and  without cough on insp or exp maneuvers and mod  Hyperresonant  to  percussion bilaterally     CV:  RRR  no s3 or murmur or increase in P2, and no edema   ABD:  soft and nontender with pos mid insp Hoover's  in the supine position. No bruits or organomegaly appreciated, bowel sounds nl  MS:   Ext warm without deformities or   obvious joint restrictions , calf tenderness, cyanosis or clubbing  SKIN: warm and dry without lesions    NEURO:  alert, approp, nl sensorium with  no motor or cerebellar deficits apparent.          I personally reviewed  images and agree with radiology impression as follows:   Chest CTa   05/16/21  Chronic emphysematous change. Interlobular septal thickening with interstitial pulmonary prominence suggests pulmonary edema. Small left pleural effusion.    Labs ordered/ reviewed:      Chemistry      Component Value Date/Time   NA 143 02/26/2023 1555   K 3.8 02/26/2023 1555   CL 92 (L) 02/26/2023 1555   CO2 36 (H) 02/26/2023 1555   BUN 5 (L) 02/26/2023 1555   CREATININE 0.56 (L) 02/26/2023 1555      Component Value Date/Time   CALCIUM 9.2 02/26/2023 1555        Lab Results  Component Value Date   WBC 8.0 02/26/2023   HGB 13.3 02/26/2023   HCT 41.0 02/26/2023   MCV 95 02/26/2023   PLT 267 02/26/2023       EOS                                                              0.1                                    02/26/2023     Lab Results  Component Value Date   TSH 1.640 02/26/2023             Assessment   COPD  GOLD 4 Quit smoking 06/2022 - Spirometry 06/21/21 FEV1 0.69 (28.8%)  Ratio 0.52  - ECHO 05/16/21 c/w cor pulmonale with RVSP  65   - CTa 05/16/21 c/w emphysema  - 02/26/2023  After extensive coaching inhaler device,  effectiveness =    75% with [facility]  - 02/26/23   alpha one AT  MM   level 184 /  EOS 0.1   Pt is Group B in terms of symptom/risk and laba/lama therefore appropriate rx at this point >>> stiolto respimat 2.5 x 2 puffs each am and approp saba:  Re SABA :  I spent extra time with pt today reviewing appropriate use of albuterol for prn use on exertion with the following points: 1) saba is for relief of sob that does not improve by walking a slower pace or resting but rather if the pt does not improve after trying this first. 2) If the pt is convinced, as many are, that saba helps recover from activity faster then it's easy to tell if this is the case by re-challenging :  ie stop, take the inhaler, then p 5 minutes try the exact same activity (intensity of workload) that just caused  the symptoms and see if they are substantially diminished or not after saba 3) if there is an activity that reproducibly causes the symptoms, try the saba 15 min before the activity on alternate days   If in fact the saba really does help, then fine to continue to use it prn but advised may need to look closer at the maintenance regimen being used to achieve better control of airways disease with exertion.        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  rx is adequate 02   Chronic respiratory failure with hypoxia and hypercapnia (HCC) Placed on 02 in [facility] 06/2021  - HC03  02/26/2023   = 36 - 02/26/2023   sats RA = 74% required 10 lpm to maintain at 90% walking slowly x  50 - 75 ft which is her baseline activity tol   Advised: Make sure you check your oxygen saturation  AT  your highest level of activity (not after you stop)   to be sure it stays 85-90%(no higher due to hypercarbia)  and adjust  02 flow upward to maintain this level if needed but remember to turn it back to previous settings when you stop (to conserve your supply).   NB Though somewhat paradoxic, when the lung fails to clear C02 properly and pC02 rises the lung then becomes a more efficient scavenger of C02 allowing lower work of breathing and  better C02 clearance albeit at a higher serum pC02 level - this is why pts can look a lot better than their ABG's would suggest and why it's so difficult to prognosticate endstage dz.  It's also why I strongly rec DNI status (ventilating pts down to a nl pC02 adversely affects this compensatory mechanism) as long as not having aecopd > admits, which she has avoided in this case for 2 years despite severe resp failure.         Each maintenance medication was reviewed in detail including emphasizing most importantly the difference between maintenance and prns and under what circumstances the prns are to be triggered using an action plan format where  appropriate.  Total time for H and P, chart review, counseling, reviewing smi/hfa/neb/02 device(s) and generating customized AVS unique to this office visit / same day charting > 45 min new pt eval           Sandrea Hughs, MD 02/26/2023

## 2023-02-26 ENCOUNTER — Ambulatory Visit: Payer: Medicare Other | Admitting: Internal Medicine

## 2023-02-26 ENCOUNTER — Encounter: Payer: Self-pay | Admitting: Internal Medicine

## 2023-02-26 VITALS — BP 108/56 | HR 97 | Ht 63.0 in | Wt 104.0 lb

## 2023-02-26 DIAGNOSIS — I2781 Cor pulmonale (chronic): Secondary | ICD-10-CM | POA: Diagnosis not present

## 2023-02-26 DIAGNOSIS — J9612 Chronic respiratory failure with hypercapnia: Secondary | ICD-10-CM

## 2023-02-26 DIAGNOSIS — J9611 Chronic respiratory failure with hypoxia: Secondary | ICD-10-CM | POA: Diagnosis not present

## 2023-02-26 DIAGNOSIS — R0609 Other forms of dyspnea: Secondary | ICD-10-CM

## 2023-02-26 DIAGNOSIS — J449 Chronic obstructive pulmonary disease, unspecified: Secondary | ICD-10-CM | POA: Diagnosis not present

## 2023-02-26 MED ORDER — STIOLTO RESPIMAT 2.5-2.5 MCG/ACT IN AERS: 2.0000 | INHALATION_SPRAY | Freq: Every day | RESPIRATORY_TRACT | 11 refills | Status: DC

## 2023-02-26 MED ORDER — ALBUTEROL SULFATE (2.5 MG/3ML) 0.083% IN NEBU: 2.5000 mg | INHALATION_SOLUTION | RESPIRATORY_TRACT | 12 refills | Status: DC | PRN

## 2023-02-26 NOTE — Patient Instructions (Addendum)
Make sure you check your oxygen saturation  AT  your highest level of activity (not after you stop)   to be sure it stays 85-90% and adjust  02 flow upward to maintain this level if needed but remember to turn it back to previous settings when you stop (to conserve your supply).     Plan A = Automatic = Always=    Stiolto 2 puffs 1st thing in am   Work on inhaler technique:  relax and gently blow all the way out then take a nice smooth full deep breath back in, triggering the inhaler at same time you start breathing in.  Hold breath in for at least  5 seconds if you can. Rinse and gargle with water when done.  If mouth or throat bother you at all,  try brushing teeth/gums/tongue with arm and hammer toothpaste/ make a slurry and gargle and spit out.       Plan B = Backup (to supplement plan A, not to replace it) Only use your albuterol inhaler as a rescue medication to be used if you can't catch your breath by resting or doing a relaxed purse lip breathing pattern.  - The less you use it, the better it will work when you need it. - Ok to use the inhaler up to 2 puffs  every 4 hours if you must but call for appointment if use goes up over your usual need - Don't leave home without it !!  (think of it like the spare tire for your car)   Plan C = Crisis (instead of Plan B but only if Plan B stops working) - only use your albuterol nebulizer if you first try Plan B and it fails to help > ok to use the nebulizer up to every 4 hours but if start needing it regularly call for immediate appointment    Please remember to go to the lab department   for your tests - we will call you with the results when they are available.      Please schedule a follow up office visit in 4 weeks, sooner if needed  with all medications /inhalers/ solutions in hand so we can verify exactly what you are taking. This includes all medications from all doctors and over the counters

## 2023-02-27 DIAGNOSIS — J449 Chronic obstructive pulmonary disease, unspecified: Secondary | ICD-10-CM | POA: Insufficient documentation

## 2023-02-27 DIAGNOSIS — J9611 Chronic respiratory failure with hypoxia: Secondary | ICD-10-CM | POA: Insufficient documentation

## 2023-02-27 DIAGNOSIS — I2781 Cor pulmonale (chronic): Secondary | ICD-10-CM | POA: Insufficient documentation

## 2023-02-27 NOTE — Assessment & Plan Note (Signed)
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

## 2023-02-27 NOTE — Assessment & Plan Note (Addendum)
Placed on 02 in [facility] 06/2021  - HC03  02/26/2023   = 36 - 02/26/2023   sats RA = 74% required 10 lpm to maintain at 90% walking slowly x  50 - 75 ft which is her baseline activity tol   Advised: Make sure you check your oxygen saturation  AT  your highest level of activity (not after you stop)   to be sure it stays 85-90%(no higher due to hypercarbia)  and adjust  02 flow upward to maintain this level if needed but remember to turn it back to previous settings when you stop (to conserve your supply).   NB Though somewhat paradoxic, when the lung fails to clear C02 properly and pC02 rises the lung then becomes a more efficient scavenger of C02 allowing lower work of breathing and  better C02 clearance albeit at a higher serum pC02 level - this is why pts can look a lot better than their ABG's would suggest and why it's so difficult to prognosticate endstage dz.  It's also why I strongly rec DNI status (ventilating pts down to a nl pC02 adversely affects this compensatory mechanism) as long as not having aecopd > admits, which she has avoided in this case for 2 years despite severe resp failure.  Return in 4 weeks with all meds in hand using a trust but verify approach to confirm accurate Medication  Reconciliation The principal here is that until we are certain that the  patients are doing what we've asked, it makes no sense to ask them to do more.      Each maintenance medication was reviewed in detail including emphasizing most importantly the difference between maintenance and prns and under what circumstances the prns are to be triggered using an action plan format where appropriate.  Total time for H and P, chart review, counseling, reviewing smi/hfa/neb/02 device(s) and generating customized AVS unique to this office visit / same day charting > 45 min new pt eval

## 2023-02-27 NOTE — Assessment & Plan Note (Addendum)
Quit smoking 06/2022 - Spirometry 06/21/21 FEV1 0.69 (28.8%)  Ratio 0.52  - ECHO 05/16/21 c/w cor pulmonale with RVSP  65   - CTa 05/16/21 c/w emphysema  - 02/26/2023  After extensive coaching inhaler device,  effectiveness =    75% with [facility]  - 02/26/23   alpha one AT  MM   level 184 /  EOS 0.1   Pt is Group B in terms of symptom/risk and laba/lama therefore appropriate rx at this point >>> stiolto respimat 2.5 x 2 puffs each am and approp saba:  Re SABA :  I spent extra time with pt today reviewing appropriate use of albuterol for prn use on exertion with the following points: 1) saba is for relief of sob that does not improve by walking a slower pace or resting but rather if the pt does not improve after trying this first. 2) If the pt is convinced, as many are, that saba helps recover from activity faster then it's easy to tell if this is the case by re-challenging : ie stop, take the inhaler, then p 5 minutes try the exact same activity (intensity of workload) that just caused the symptoms and see if they are substantially diminished or not after saba 3) if there is an activity that reproducibly causes the symptoms, try the saba 15 min before the activity on alternate days   If in fact the saba really does help, then fine to continue to use it prn but advised may need to look closer at the maintenance regimen being used to achieve better control of airways disease with exertion.

## 2023-02-28 LAB — BASIC METABOLIC PANEL
BUN/Creatinine Ratio: 9 (ref 9–23)
BUN: 5 mg/dL — ABNORMAL LOW (ref 6–24)
CO2: 36 mmol/L — ABNORMAL HIGH (ref 20–29)
Calcium: 9.2 mg/dL (ref 8.7–10.2)
Chloride: 92 mmol/L — ABNORMAL LOW (ref 96–106)
Creatinine, Ser: 0.56 mg/dL — ABNORMAL LOW (ref 0.57–1.00)
Glucose: 77 mg/dL (ref 70–99)
Potassium: 3.8 mmol/L (ref 3.5–5.2)
Sodium: 143 mmol/L (ref 134–144)
eGFR: 105 mL/min/{1.73_m2} (ref >59)

## 2023-02-28 LAB — CBC WITH DIFFERENTIAL/PLATELET
Basophils Absolute: 0 10*3/uL (ref 0.0–0.2)
Basos: 0 %
EOS (ABSOLUTE): 0.1 10*3/uL (ref 0.0–0.4)
Eos: 1 %
Hematocrit: 41 % (ref 34.0–46.6)
Hemoglobin: 13.3 g/dL (ref 11.1–15.9)
Immature Grans (Abs): 0 10*3/uL (ref 0.0–0.1)
Immature Granulocytes: 0 %
Lymphocytes Absolute: 2.8 10*3/uL (ref 0.7–3.1)
Lymphs: 35 %
MCH: 30.8 pg (ref 26.6–33.0)
MCHC: 32.4 g/dL (ref 31.5–35.7)
MCV: 95 fL (ref 79–97)
Monocytes Absolute: 0.6 10*3/uL (ref 0.1–0.9)
Monocytes: 7 %
Neutrophils Absolute: 4.5 10*3/uL (ref 1.4–7.0)
Neutrophils: 57 %
Platelets: 267 10*3/uL (ref 150–450)
RBC: 4.32 x10E6/uL (ref 3.77–5.28)
RDW: 11.2 % — ABNORMAL LOW (ref 11.7–15.4)
WBC: 8 10*3/uL (ref 3.4–10.8)

## 2023-02-28 LAB — ALPHA-1-ANTITRYPSIN PHENOTYP: A-1 Antitrypsin: 184 mg/dL (ref 101–187)

## 2023-02-28 LAB — BRAIN NATRIURETIC PEPTIDE: BNP: 8.5 pg/mL (ref 0.0–100.0)

## 2023-02-28 LAB — TSH: TSH: 1.64 u[IU]/mL (ref 0.450–4.500)

## 2023-04-01 NOTE — Progress Notes (Deleted)
Joanne Larson, female    DOB: June 04, 1961    MRN: [id number]   Brief patient profile:  62  yowf quit smoking 06/2022/MM with GOLD 4 copd criteria  06/21/21 and hypercarbic/hypoxemic RF at that point self - referred to pulmonary clinic in [facility]  02/26/2023  for copd   Onset of doe x 11/2020 >  seen by  Dr Vergie Living  in [facility] sept 2022  "for disability eval"  > instead  admitted to [facility] and d/c'd on 6 lpm and stiolto which helped some until it ran out    History of Present Illness  02/26/2023  Pulmonary/ 1st office eval/ Chole Driver / [facility] on no maint rx/ using 02 prn  Chief Complaint  Patient presents with   Consult  Dyspnea:  still able to do cooking/ some cleaning  Gets let off at curb/ @ [employer]  Cough: smoker's rattle > no mucus  Sleep: level bed  2 pillows  SABA use: once or twice daily at most, just hfa though has neb, doe not have solution  02: home conc = 6lpm Rec Make sure you check your oxygen saturation  AT  your highest level of activity (not after you stop)   to be sure it stays 85-90%   Plan A = Automatic = Always=    Stiolto 2 puffs 1st thing in am  Work on inhaler technique: Plan B = Backup (to supplement plan A, not to replace it) Only use your albuterol inhaler as a rescue medication Plan C = Crisis (instead of Plan B but only if Plan B stops working)   alpha one AT  MM   level 184 /  EOS 0.1          04/02/2023  f/u ov/[place] [facility]/Corrina Steffensen re: 02 dep resp failure/ GOLD 4 copd  maint on *** did bring meds *** No chief complaint on file.   Dyspnea:  *** Cough: *** Sleeping: *** SABA use: *** 02: *** Covid status: *** Lung cancer screening: ***   No obvious day to day or daytime variability or assoc excess/ purulent sputum or mucus plugs or hemoptysis or cp or chest tightness, subjective wheeze or overt sinus or hb symptoms.   *** without nocturnal  or early am exacerbation  of respiratory  c/o's or need for  noct saba. Also denies any obvious fluctuation of symptoms with weather or environmental changes or other aggravating or alleviating factors except as outlined above   No unusual exposure hx or h/o childhood pna/ asthma or knowledge of premature birth.  Current Allergies, Complete Past Medical History, Past Surgical History, Family History, and Social History were reviewed in [employer] record.  ROS  The following are not active complaints unless bolded Hoarseness, sore throat, dysphagia, dental problems, itching, sneezing,  nasal congestion or discharge of excess mucus or purulent secretions, ear ache,   fever, chills, sweats, unintended wt loss or wt gain, classically pleuritic or exertional cp,  orthopnea pnd or arm/hand swelling  or leg swelling, presyncope, palpitations, abdominal pain, anorexia, nausea, vomiting, diarrhea  or change in bowel habits or change in bladder habits, change in stools or change in urine, dysuria, hematuria,  rash, arthralgias, visual complaints, headache, numbness, weakness or ataxia or problems with walking or coordination,  change in mood or  memory.        No outpatient medications have been marked as taking for the 04/02/23 encounter (Appointment) with Nyoka Cowden, MD.  Objective:     Wt Readings from Last 3 Encounters:  02/26/23 104 lb (47.2 kg)  11/03/16 106 lb (48.1 kg)  11/19/14 115 lb (52.2 kg)      Vital signs reviewed  04/02/2023  - Note at rest 02 sats  ***% on ***   General appearance:    ***    Mod barr***      .                   Assessment

## 2023-04-02 ENCOUNTER — Ambulatory Visit: Payer: BLUE CROSS/BLUE SHIELD | Admitting: Internal Medicine

## 2023-04-07 NOTE — Progress Notes (Deleted)
Joanne Larson, female    DOB: Jan 21, 1961    MRN: [id number]   Brief patient profile:  62  yowf quit smoking 06/2022/MM with GOLD 4 copd criteria  06/21/21 and hypercarbic/hypoxemic RF at that point self - referred to pulmonary clinic in [facility]  02/26/2023  for copd   Onset of doe x 11/2020 >  seen by  Dr Vergie Living  in [facility] sept 2022  "for disability eval"  > instead  admitted to [facility] and d/c'd on 6 lpm and stiolto which helped some until it ran out    History of Present Illness  02/26/2023  Pulmonary/ 1st office eval/  / [facility] on no maint rx/ using 02 prn  Chief Complaint  Patient presents with   Consult  Dyspnea:  still able to do cooking/ some cleaning  Gets let off at curb/ @ [employer]  Cough: smoker's rattle > no mucus  Sleep: level bed  2 pillows  SABA use: once or twice daily at most, just hfa though has neb, doe not have solution  02: home conc = 6lpm Rec Make sure you check your oxygen saturation  AT  your highest level of activity (not after you stop)   to be sure it stays 85-90%   Plan A = Automatic = Always=    Stiolto 2 puffs 1st thing in am  Work on inhaler technique: Plan B = Backup (to supplement plan A, not to replace it) Only use your albuterol inhaler as a rescue medication Plan C = Crisis (instead of Plan B but only if Plan B stops working)   alpha one AT  MM   level 184 /  EOS 0.1        04/08/2023  f/u ov/[place] [facility]/ re: 02 dep resp failure/ GOLD 4 copd  maint on *** did bring meds *** No chief complaint on file.   Dyspnea:  *** Cough: *** Sleeping: *** SABA use: *** 02: *** Covid status: *** Lung cancer screening: ***   No obvious day to day or daytime variability or assoc excess/ purulent sputum or mucus plugs or hemoptysis or cp or chest tightness, subjective wheeze or overt sinus or hb symptoms.   *** without nocturnal  or early am exacerbation  of respiratory  c/o's or need for noct  saba. Also denies any obvious fluctuation of symptoms with weather or environmental changes or other aggravating or alleviating factors except as outlined above   No unusual exposure hx or h/o childhood pna/ asthma or knowledge of premature birth.  Current Allergies, Complete Past Medical History, Past Surgical History, Family History, and Social History were reviewed in [employer] record.  ROS  The following are not active complaints unless bolded Hoarseness, sore throat, dysphagia, dental problems, itching, sneezing,  nasal congestion or discharge of excess mucus or purulent secretions, ear ache,   fever, chills, sweats, unintended wt loss or wt gain, classically pleuritic or exertional cp,  orthopnea pnd or arm/hand swelling  or leg swelling, presyncope, palpitations, abdominal pain, anorexia, nausea, vomiting, diarrhea  or change in bowel habits or change in bladder habits, change in stools or change in urine, dysuria, hematuria,  rash, arthralgias, visual complaints, headache, numbness, weakness or ataxia or problems with walking or coordination,  change in mood or  memory.        No outpatient medications have been marked as taking for the 04/08/23 encounter (Appointment) with Nyoka Cowden, MD.  Objective:     Wt Readings from Last 3 Encounters:  02/26/23 104 lb (47.2 kg)  11/03/16 106 lb (48.1 kg)  11/19/14 115 lb (52.2 kg)      Vital signs reviewed  04/08/2023  - Note at rest 02 sats  ***% on ***   General appearance:    ***    Mod barr***      .                   Assessment

## 2023-04-08 ENCOUNTER — Ambulatory Visit: Payer: BLUE CROSS/BLUE SHIELD | Admitting: Internal Medicine

## 2023-04-09 ENCOUNTER — Encounter: Payer: Self-pay | Admitting: Internal Medicine

## 2023-06-21 ENCOUNTER — Ambulatory Visit: Payer: Medicare Other | Admitting: Internal Medicine

## 2023-06-21 ENCOUNTER — Encounter: Payer: Self-pay | Admitting: Internal Medicine

## 2023-06-21 NOTE — Progress Notes (Deleted)
Joanne Larson, female    DOB: [birth year]    MRN: [id number]   Brief patient profile:  51  yowf quit smoking 06/2022/MM with GOLD 4 copd criteria  06/21/21 and hypercarbic/hypoxemic RF at that point self - referred to pulmonary clinic in [facility]  02/26/2023  for copd   Onset of doe x 11/2020 >  seen by  Dr Vergie Living  in [facility] sept 2022  "for disability eval"  > instead  admitted to [facility] and d/c'd on 6 lpm and stiolto which helped some until it ran out    History of Present Illness  02/26/2023  Pulmonary/ 1st office eval/ Joanne Larson / [facility] on no maint rx/ using 02 prn  Chief Complaint  Patient presents with   Consult  Dyspnea:  still able to do cooking/ some cleaning  Gets let off at curb/ @ [employer]  Cough: smoker's rattle > no mucus  Sleep: level bed  2 pillows  SABA use: once or twice daily at most, just hfa though has neb, doe not have solution  02: home conc = 6lpm Rec Make sure you check your oxygen saturation  AT  your highest level of activity (not after you stop)   to be sure it stays 85-90% a Plan A = Automatic = Always=    Stiolto 2 puffs 1st thing in am  Work on inhaler technique:  Plan B = Backup (to supplement plan A, not to replace it) Only use your albuterol inhaler as a rescue medication Plan C = Crisis (instead of Plan B but only if Plan B stops working) - only use your albuterol nebulizer if you first try Plan B  Please schedule a follow up office visit in 4 weeks, sooner if needed  with all medications /inhalers/ solutions in hand     02/26/23   alpha one AT  MM   level 184 /  EOS 0.1   06/21/2023  f/u ov/[place] [facility]/Amela Handley re: *** maint on *** did *** bring meds  No chief complaint on file.   Dyspnea:  *** Cough: *** Sleeping: ***   resp cc  SABA use: *** 02: ***  Lung cancer screening: ***   No obvious day to day or daytime variability or assoc excess/ purulent sputum or mucus plugs or hemoptysis or cp or  chest tightness, subjective wheeze or overt sinus or hb symptoms.    Also denies any obvious fluctuation of symptoms with weather or environmental changes or other aggravating or alleviating factors except as outlined above   No unusual exposure hx or h/o childhood pna/ asthma or knowledge of premature birth.  Current Allergies, Complete Past Medical History, Past Surgical History, Family History, and Social History were reviewed in [employer] record.  ROS  The following are not active complaints unless bolded Hoarseness, sore throat, dysphagia, dental problems, itching, sneezing,  nasal congestion or discharge of excess mucus or purulent secretions, ear ache,   fever, chills, sweats, unintended wt loss or wt gain, classically pleuritic or exertional cp,  orthopnea pnd or arm/hand swelling  or leg swelling, presyncope, palpitations, abdominal pain, anorexia, nausea, vomiting, diarrhea  or change in bowel habits or change in bladder habits, change in stools or change in urine, dysuria, hematuria,  rash, arthralgias, visual complaints, headache, numbness, weakness or ataxia or problems with walking or coordination,  change in mood or  memory.        No outpatient medications have been marked as  taking for the 06/21/23 encounter (Appointment) with Nyoka Cowden, MD.                 Objective:    Wts  06/21/2023      ***   02/26/23 104 lb (47.2 kg)  11/03/16 106 lb (48.1 kg)  11/19/14 115 lb (52.2 kg)      Vital signs reviewed  06/21/2023  - Note at rest 02 sats  ***% on ***   General appearance:    ***    Mod bar***    Assessment

## 2023-06-24 ENCOUNTER — Telehealth: Payer: Self-pay

## 2023-06-24 NOTE — Telephone Encounter (Signed)
Per pharmacy Stiolto is not covered on patient's insurance.   Per pharmacy patient has not picked up this rx since 04/29/23. Patient has canceled and no show'd recent appts, does not have follow up scheduled at this time.   Fax returned to pharmacy to indicate patient needs OV to discuss medications.

## 2023-08-01 ENCOUNTER — Ambulatory Visit: Payer: Medicare Other | Admitting: Internal Medicine

## 2023-08-13 NOTE — Progress Notes (Unsigned)
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 sept 2022  "for disability eval"  > instead  admitted to [facility] and d/c'd on 6 lpm and stiolto which helped some until it ran out    History of Present Illness  02/26/2023  Pulmonary/ 1st office eval/ Joanne Larson / [facility] on no maint rx/ using 02 prn  Chief Complaint  Patient presents with   Consult  Dyspnea:  still able to do cooking/ some cleaning  Gets let off at curb/ @ [employer]  Cough: smoker's rattle > no mucus  Sleep: level bed  2 pillows  SABA use: once or twice daily at most, just hfa though has neb, doe not have solution  02: home conc = 6lpm Rec Make sure you check your oxygen saturation  AT  your highest level of activity (not after you stop)   to be sure it stays 85-90%   Plan A = Automatic = Always=    Stiolto 2 puffs 1st thing in am  Work on inhaler technique:     Plan B = Backup (to supplement plan A, not to replace it) Only use your albuterol inhaler as a rescue medication)  Plan C = Crisis (instead of Plan B but only if Plan B stops working) - only use your albuterol nebulizer if you first try Plan B  Labs: 02/26/23   alpha one AT  MM   level 184 /  EOS 0.1    Please schedule a follow up office visit in 4 weeks, sooner if needed  with all medications /inhalers/ solutions in hand so we can verify exactly what you are taking. This includes all medications from all doctors and over the counters    08/14/2023  f/u ov/[place] office/Baldemar Dady re: GOLD 4 copd  maint on ***  No chief complaint on file.   Dyspnea:  *** Cough: *** Sleeping: ***   resp cc  SABA use: *** 02: ***  Lung cancer screening:  ***   No obvious day to day or daytime variability or assoc excess/ purulent sputum or mucus plugs or hemoptysis or cp or chest tightness, subjective wheeze or overt sinus or hb symptoms.    Also denies any obvious fluctuation of symptoms with weather or environmental changes or other aggravating or alleviating factors except as outlined above   No unusual exposure hx or h/o childhood pna/ asthma or knowledge of premature birth.  Current Allergies, Complete Past Medical History, Past Surgical History, Family History, and Social History were reviewed in [employer] record.  ROS  The following are not active complaints unless bolded Hoarseness, sore throat, dysphagia, dental problems, itching, sneezing,  nasal congestion or discharge of excess mucus or purulent secretions, ear ache,   fever, chills, sweats, unintended wt loss or wt gain, classically pleuritic or exertional cp,  orthopnea pnd or arm/hand swelling  or leg swelling, presyncope, palpitations, abdominal pain, anorexia, nausea, vomiting, diarrhea  or change in bowel habits or change in bladder habits, change in stools or change in urine, dysuria, hematuria,  rash, arthralgias, visual complaints, headache, numbness, weakness or  ataxia or problems with walking or coordination,  change in mood or  memory.        No outpatient medications have been marked as taking for the 08/14/23 encounter (Appointment) with Nyoka Cowden, MD.             Objective:    Wt Readings from Last 3 Encounters:  02/26/23 104 lb (47.2 kg)  11/03/16 106 lb (48.1 kg)  11/19/14 115 lb (52.2 kg)      Vital signs reviewed  08/14/2023  - Note at rest 02 sats  ***% on ***   General appearance:    ***         Mod barr***            Assessment

## 2023-08-14 ENCOUNTER — Ambulatory Visit: Payer: Medicare Other | Admitting: Internal Medicine

## 2023-08-15 ENCOUNTER — Encounter: Payer: Self-pay | Admitting: Internal Medicine

## 2023-08-15 ENCOUNTER — Ambulatory Visit: Payer: Medicare Other | Admitting: Internal Medicine

## 2023-08-15 VITALS — BP 145/76 | HR 82 | Ht 63.0 in | Wt 101.0 lb

## 2023-08-15 DIAGNOSIS — J9612 Chronic respiratory failure with hypercapnia: Secondary | ICD-10-CM

## 2023-08-15 DIAGNOSIS — J9611 Chronic respiratory failure with hypoxia: Secondary | ICD-10-CM

## 2023-08-15 DIAGNOSIS — J449 Chronic obstructive pulmonary disease, unspecified: Secondary | ICD-10-CM

## 2023-08-15 MED ORDER — ALBUTEROL SULFATE HFA 108 (90 BASE) MCG/ACT IN AERS: INHALATION_SPRAY | RESPIRATORY_TRACT | 11 refills | Status: DC

## 2023-08-15 MED ORDER — AZITHROMYCIN 250 MG PO TABS: ORAL_TABLET | ORAL | 11 refills | Status: DC

## 2023-08-15 MED ORDER — ALBUTEROL SULFATE (2.5 MG/3ML) 0.083% IN NEBU: 2.5000 mg | INHALATION_SOLUTION | RESPIRATORY_TRACT | 12 refills | Status: DC | PRN

## 2023-08-15 MED ORDER — TRELEGY ELLIPTA 100-62.5-25 MCG/ACT IN AEPB: INHALATION_SPRAY | RESPIRATORY_TRACT | 11 refills | Status: DC

## 2023-08-15 MED ORDER — PREDNISONE 10 MG PO TABS: ORAL_TABLET | ORAL | 0 refills | Status: DC

## 2023-08-15 NOTE — Progress Notes (Signed)
Joanne Larson, female    DOB: 12-31-1960    MRN: [id number]   Brief patient profile:  62  yowf    quit smoking 06/2022/MM with GOLD 4 copd criteria  06/21/21 and hypercarbic/hypoxemic RF at that point self - referred to pulmonary clinic in [facility]  02/26/2023  for copd   Onset of doe x 11/2020 >  seen by  Dr Vergie Living  in [facility] sept 2022  "for disability eval"  > instead  admitted to [facility] and d/c'd on 6 lpm and stiolto which helped some until it ran out    History of Present Illness  02/26/2023  Pulmonary/ 1st office eval/ Yazhini Mcaulay / [facility] on no maint rx/ using 02 prn  Chief Complaint  Patient presents with   Consult  Dyspnea:  still able to do cooking/ some cleaning  Gets let off at curb/ @ [employer]  Cough: smoker's rattle > no mucus  Sleep: level bed  2 pillows  SABA use: once or twice daily at most, just hfa though has neb, doe not have solution  02: home conc = 6lpm Rec Make sure you check your oxygen saturation  AT  your highest level of activity (not after you stop)   to be sure it stays 85-90%   Plan A = Automatic = Always=    Stiolto 2 puffs 1st thing in am  Work on inhaler technique:     Plan B = Backup (to supplement plan A, not to replace it) Only use your albuterol inhaler as a rescue medication)  Plan C = Crisis (instead of Plan B but only if Plan B stops working) - only use your albuterol nebulizer if you first try Plan B  Labs: 02/26/23   Alpha one AT  MM   level 184 /  EOS 0.1    Please schedule a follow up office visit in 4 weeks, sooner if needed  with all medications /inhalers/ solutions in hand so we can verify exactly what you are taking. This includes all medications from all doctors and over the counters    08/15/2023  f/u ov/[place] office/Tiwatope Emmitt re: GOLD 4 copd /02 dep/ cor pulmonale  maint on alb prn  - out of all meds  Chief Complaint  Patient presents with   Shortness of Breath  Dyspnea:  still cooking /  cleaning  Cough: smoker's rattle / mucus clear  Sleeping: level bed  2 pillow s resp cc  SABA use: completely out  02: 6lpm - does not titrate      No obvious day to day or daytime variability or assoc excess/ purulent sputum or mucus plugs or hemoptysis or cp or chest tightness, subjective wheeze or overt   hb symptoms.    Also denies any obvious fluctuation of symptoms with weather or environmental changes or other aggravating or alleviating factors except as outlined above   No unusual exposure hx or h/o childhood pna/ asthma or knowledge of premature birth.  Current Allergies, Complete Past Medical History, Past Surgical History, Family History, and Social History were reviewed in [employer] record.  ROS  The following are not active complaints unless bolded Hoarseness, sore throat, dysphagia, dental problems, itching, sneezing,  nasal congestion or discharge of excess mucus or purulent secretions, ear ache,   fever, chills, sweats, unintended wt loss or wt gain, classically pleuritic or exertional cp,  orthopnea pnd or arm/hand swelling  or leg swelling, presyncope, palpitations, abdominal pain, anorexia,  nausea, vomiting, diarrhea  or change in bowel habits or change in bladder habits, change in stools or change in urine, dysuria, hematuria,  rash, arthralgias, visual complaints, headache, numbness, weakness or ataxia or problems with walking or coordination,  change in mood or  memory.        Current Meds  Medication Sig   albuterol (PROVENTIL) (2.5 MG/3ML) 0.083% nebulizer solution Take 3 mLs (2.5 mg total) by nebulization every 4 (four) hours as needed for wheezing or shortness of breath.   Albuterol Sulfate, sensor, (PROAIR DIGIHALER) 108 (90 Base) MCG/ACT AEPB Inhale into the lungs.   diclofenac (VOLTAREN) 75 MG EC tablet Take 1 tablet (75 mg total) by mouth 2 (two) times daily.             Objective:    Wt Readings from Last 3 Encounters:   08/15/23 101 lb (45.8 kg)  02/26/23 104 lb (47.2 kg)  11/03/16 106 lb (48.1 kg)      Vital signs reviewed  08/15/2023  - Note at rest 02 sats  88% on 8 lpm cont    General appearance:    amb slt tremulous wf congested sounding cough     HEENT :  Oropharynx  clear   Nasal turbinates nl    NECK :  without JVD/Nodes/TM/ nl carotid upstrokes bilaterally   LUNGS: no acc muscle use,  Mod barrel  contour chest wall with bilateral  Distant  wheeze and  without cough on insp or exp maneuvers and mod  Hyperresonant  to  percussion bilaterally     CV:  RRR  no s3 or murmur or increase in P2, and no edema   ABD:  soft and nontender with pos mid insp Hoover's  in the supine position. No bruits or organomegaly appreciated, bowel sounds nl  MS:   Ext warm without deformities or   obvious joint restrictions , calf tenderness, cyanosis or clubbing  SKIN: warm and dry without lesions    NEURO:  alert, approp, nl sensorium with  no motor or cerebellar deficits apparent.            CXR PA and Lateral:   08/15/2023 :    I personally reviewed images and impression is as follows:     Did not go for cxr as rec        Assessment

## 2023-08-15 NOTE — Patient Instructions (Addendum)
Plan A = Automatic = Always=    Trelegy 100 one each am   Work on inhaler technique:  relax and gently blow all the way out then take a nice smooth full deep breath back in, triggering the inhaler at same time you start breathing in.  Hold breath in for at least  5 seconds if you can. Blow out trelegy  thru nose. Rinse and gargle with water when done.  If mouth or throat bother you at all,  try brushing teeth/gums/tongue with arm and hammer toothpaste/ make a slurry and gargle and spit out.      Plan B = Backup (to supplement plan A, not to replace it) Only use your albuterol inhaler (proaire )as a rescue medication to be used if you can't catch your breath by resting or doing a relaxed purse lip breathing pattern.  - The less you use it, the better it will work when you need it. - Ok to use the inhaler up to 2 puffs  every 4 hours if you must but call for appointment if use goes up over your usual need - Don't leave home without it !!  (think of it like the spare tire for your car)   Plan C = Crisis (instead of Plan B but only if Plan B stops working) - only use your albuterol nebulizer if you first try Plan B and it fails to help > ok to use the nebulizer up to every 4 hours but if start needing it regularly call for immediate appointment  Make sure you check your oxygen saturation  AT  your highest level of activity (not after you stop)   to be sure it stays over 90% and adjust  02 flow upward to maintain this level if needed but remember to turn it back to previous settings when you stop (to conserve your supply).  Change in mucus > zpak x 5 days   Prednisone 10 mg take  4 each am x 2 days,   2 each am x 2 days,  1 each am x 2 days and stop   Please remember to go to the  x-ray department  @  [facility] for your tests - we will call you with the results when they are available       Please schedule a follow up visit in 3 months but call sooner if needed

## 2023-08-16 NOTE — Assessment & Plan Note (Signed)
Quit smoking 06/2022/MM - Spirometry 06/21/21 FEV1 0.69 (28.8%)  Ratio 0.52  - ECHO 05/16/21 c/w cor pulmonale with RVSP  65   - CTa 05/16/21 c/w emphysema  - 02/26/2023  After extensive coaching inhaler device,  effectiveness =    75% with [facility]  - 02/26/23   alpha one AT  MM   level 184 /  EOS 0.1  - 08/15/2023  After extensive coaching inhaler device,  effectiveness =  80% with dpi > start trelelgy 100      Group D (now reclassified as E) in terms of symptom/risk and laba/lama/ICS  therefore appropriate rx at this point >>>  trelegy and approp saba using ABC plan - see avs for instructions unique to this ov     Zpak for flare plus  Prednisone 10 mg take  4 each am x 2 days,   2 each am x 2 days,  1 each am x 2 days and stop   F/u  q 3 m

## 2023-08-16 NOTE — Assessment & Plan Note (Signed)
Placed on 02 in [facility] 06/2021  - HC03  02/26/2023   = 36 - 02/26/2023   sats RA = 74% required 10lpm to maintain at 90% walking slowly x  50 - 75 ft which is her baseline activity tol   Tolerates hypoxemia well but in view of h/o cor pulmonale needs to pay closer attention to 02 titration:  Advised: Make sure you check your oxygen saturation  AT  your highest level of activity (not after you stop)   to be sure it stays over 90% and adjust  02 flow upward to maintain this level if needed but remember to turn it back to previous settings when you stop (to conserve your supply).           Each maintenance medication was reviewed in detail including emphasizing most importantly the difference between maintenance and prns and under what circumstances the prns are to be triggered using an action plan format where appropriate.  Total time for H and P, chart review, counseling, reviewing hfa/neb/02/ pulse ox  device(s) and generating customized AVS unique to this office visit / same day charting = 30 min

## 2023-09-23 ENCOUNTER — Telehealth: Payer: Self-pay | Admitting: Internal Medicine

## 2023-09-23 DIAGNOSIS — J9611 Chronic respiratory failure with hypoxia: Secondary | ICD-10-CM

## 2023-09-23 DIAGNOSIS — J449 Chronic obstructive pulmonary disease, unspecified: Secondary | ICD-10-CM

## 2023-09-23 NOTE — Telephone Encounter (Signed)
 Order placed with [employer]. They will contact patient. Nothing further needed at this time.

## 2023-09-23 NOTE — Telephone Encounter (Signed)
 Patient would like a new oxygen supplier. 24 hour oxygen at 6 Liters. Please call and advise [phone] or [phone]

## 2023-10-03 ENCOUNTER — Telehealth: Payer: Self-pay | Admitting: Internal Medicine

## 2023-10-03 NOTE — Telephone Encounter (Signed)
I sent urgent message to [employer] asking them to check on this issue 

## 2023-10-03 NOTE — Telephone Encounter (Signed)
Just an FYI:  It looks like the patient requested to change DME companies after her visit with Dr. Sherene Sires in December.  Eulis Canner C    Telephone Encounter Signed   Encounter Date: 09/23/2023   Signed      Patient would like a new oxygen supplier. 24 hour oxygen at 6 Liters. Please call and advise [phone] or [phone]          [facility] patient x1.  No answer.  VM full and she does not have mychart.

## 2023-10-03 NOTE — Telephone Encounter (Signed)
I spoke with Elease Hashimoto with [employer] who advised that the patient will need a walk test to re-qualify for O2.  Pt was seen more than 30 days before an order was sent to [employer].

## 2023-10-03 NOTE — Telephone Encounter (Signed)
Patient states [employer] did not receive order for oxygen. Patient spoke to [employer]. Patient phone number is [phone].

## 2023-10-04 NOTE — Telephone Encounter (Signed)
I tried calling the pt and there was no answer and no vm  Closing per protocol

## 2023-10-14 ENCOUNTER — Telehealth: Payer: Self-pay | Admitting: Internal Medicine

## 2023-10-14 NOTE — Telephone Encounter (Signed)
-   02/26/2023   sats RA = 74% required 10lpm to maintain at 90% walking slowly x  50 - 75 ft which is her baseline activity tol

## 2023-10-14 NOTE — Telephone Encounter (Signed)
Joanne Larson; Joanne Larson We will need walk test results please.

## 2023-10-16 NOTE — Telephone Encounter (Signed)
 Pt has an appt to be seen on 11/13/23

## 2023-10-16 NOTE — Telephone Encounter (Signed)
 Joanne Larson We will need sats within the last 30days please.

## 2023-10-16 NOTE — Telephone Encounter (Signed)
 I will close this order and a new one will have to be placed once patient is seen.

## 2023-11-11 NOTE — Progress Notes (Unsigned)
 Joanne Larson, female    DOB: 06/04/1961    MRN: [id number]   Brief patient profile:  62  yowf  quit smoking 06/2022/MM   with GOLD 4 copd criteria  06/21/21 and hypercarbic/hypoxemic RF at that point self - referred to pulmonary clinic in [facility]  02/26/2023  for copd   Onset of doe x 11/2020 >  seen by  Dr Vergie Living  in [facility] sept 2022  "for disability eval"  > instead  admitted to [facility] and d/c'd on 6 lpm and stiolto which helped some until it ran out    History of Present Illness  02/26/2023  Pulmonary/ 1st office eval/ Joanne Larson / [facility] on no maint rx/ using 02 prn  Chief Complaint  Patient presents with   Consult  Dyspnea:  still able to do cooking/ some cleaning  Gets let off at curb/ @ [employer] rides scooter  Cough: smoker's rattle > no mucus  Sleep: level bed  2 pillows  SABA use: once or twice daily at most, just hfa though has neb, doe not have solution  02: home conc = 6lpm Rec Make sure you check your oxygen saturation  AT  your highest level of activity (not after you stop)   to be sure it stays 85-90%   Plan A = Automatic = Always=    Stiolto 2 puffs 1st thing in am  Work on inhaler technique:     Plan B = Backup (to supplement plan A, not to replace it) Only use your albuterol inhaler as a rescue medication)  Plan C = Crisis (instead of Plan B but only if Plan B stops working) - only use your albuterol nebulizer if you first try Plan B Labs: 02/26/23   Alpha one AT  MM   level 184 /  EOS 0.1   Please schedule a follow up office visit in 4 weeks, sooner if needed  with all medications /inhalers/ solutions in hand    08/15/2023  f/u ov/[place] office/Joanne Larson re: GOLD 4 copd /02 dep/ cor pulmonale  maint on alb prn  - out of all meds  Chief Complaint  Patient presents with   Shortness of Breath  Dyspnea:  still cooking / cleaning  Cough: smoker's rattle / mucus clear  Sleeping: level bed  2 pillow s resp cc  SABA use: completely out  02:  6lpm - does not titrate  Rec Plan A = Automatic = Always=    Trelegy 100 one each am  Work on inhaler technique:   Plan B = Backup (to supplement plan A, not to replace it) Only use your albuterol inhaler (proaire )as a rescue medication Plan C = Crisis (instead of Plan B but only if Plan B stops working) - only use your albuterol nebulizer if you first try Plan B  Make sure you check your oxygen saturation  AT  your highest level of activity (not after you stop)   to be sure it stays over 90%  Change in mucus > zpak x 5 days  Prednisone 10 mg take  4 each am x 2 days,   2 each am x 2 days,  1 each am x 2 days and stop       11/13/2023  f/u ov/[place] office/Joanne Larson re: GOLD 4 COPD/ 02 dep/ cor pulmonale maint on trelegy 100    Chief Complaint  Patient presents with   Follow-up    Pt wants discuss  oxygen   Dyspnea:  very sedentary / mb and back 300 ft round trip flat , stops half way both ways x 3 y  Cough: none  Sleeping: flat bed 2 pillows s  resp cc  SABA use: hfa > neb  avg one hfa/day  and neb one/rehab  02: 6lpm 25/7    No obvious day to day or daytime variability or assoc excess/ purulent sputum or mucus plugs or hemoptysis or cp or chest tightness, subjective wheeze or overt sinus or hb symptoms.    Also denies any obvious fluctuation of symptoms with weather or environmental changes or other aggravating or alleviating factors except as outlined above   No unusual exposure hx or h/o childhood pna/ asthma or knowledge of premature birth.  Current Allergies, Complete Past Medical History, Past Surgical History, Family History, and Social History were reviewed in [employer] record.  ROS  The following are not active complaints unless bolded Hoarseness, sore throat, dysphagia, dental problems, itching, sneezing,  nasal congestion or discharge of excess mucus or purulent secretions, ear ache,   fever, chills, sweats, unintended wt loss or wt gain,  classically pleuritic or exertional cp,  orthopnea pnd or arm/hand swelling  or leg swelling, presyncope, palpitations, abdominal pain, anorexia, nausea, vomiting, diarrhea  or change in bowel habits or change in bladder habits, change in stools or change in urine, dysuria, hematuria,  rash, arthralgias, visual complaints, headache, numbness, weakness or ataxia or problems with walking or coordination,  change in mood or  memory.        Current Meds  Medication Sig   albuterol (PROAIR HFA) 108 (90 Base) MCG/ACT inhaler 2 puffs every 4 hours as needed only  if your can't catch your breath   albuterol (PROVENTIL) (2.5 MG/3ML) 0.083% nebulizer solution Take 3 mLs (2.5 mg total) by nebulization every 4 (four) hours as needed for wheezing or shortness of breath.   diclofenac (VOLTAREN) 75 MG EC tablet Take 1 tablet (75 mg total) by mouth 2 (two) times daily.   Fluticasone-Umeclidin-Vilant (TRELEGY ELLIPTA) 100-62.5-25 MCG/ACT AEPB One click each am                   Objective:    Wts    11/13/2023         101  08/15/23 101 lb (45.8 kg)  02/26/23 104 lb (47.2 kg)  11/03/16 106 lb (48.1 kg)     Vital signs reviewed  11/13/2023  - Note at rest 02 sats  83% on RA - 91% on 6lpm   General appearance:   amb  chronically ill wf > stated age   HEENT :  Oropharynx  clear   Nasal turbinates nl    NECK :  without JVD/Nodes/TM/ nl carotid upstrokes bilaterally   LUNGS: no acc muscle use,  Mod barrel  contour chest wall with bilateral  Distant bs s audible wheeze and  without cough on insp or exp maneuvers and mod  Hyperresonant  to  percussion bilaterally     CV:  RRR  no s3 or murmur or increase in P2, and no edema   ABD:  soft and nontender with pos mid insp Hoover's  in the supine position. No bruits or organomegaly appreciated, bowel sounds nl  MS:   Ext warm without deformities or   obvious joint restrictions , calf tenderness, cyanosis or clubbing  SKIN: warm and dry without lesions     NEURO:  alert, approp, nl sensorium with  no motor or  cerebellar deficits apparent.                  CXR PA and Lateral:   11/13/2023 :    I personally reviewed images and impression is as follows:     Severe copd / no acute finidings      Assessment

## 2023-11-13 ENCOUNTER — Ambulatory Visit (HOSPITAL_COMMUNITY)
Admission: RE | Admit: 2023-11-13 | Discharge: 2023-11-13 | Disposition: A | Discharge status: Home / Self Care | Source: Ambulatory Visit | Attending: Internal Medicine | Admitting: Internal Medicine

## 2023-11-13 ENCOUNTER — Ambulatory Visit: Payer: Medicare Other | Admitting: Internal Medicine

## 2023-11-13 ENCOUNTER — Encounter: Payer: Self-pay | Admitting: Internal Medicine

## 2023-11-13 VITALS — BP 144/67 | HR 104 | Ht 63.0 in | Wt 101.0 lb

## 2023-11-13 DIAGNOSIS — J449 Chronic obstructive pulmonary disease, unspecified: Secondary | ICD-10-CM

## 2023-11-13 DIAGNOSIS — Z87891 Personal history of nicotine dependence: Secondary | ICD-10-CM | POA: Diagnosis not present

## 2023-11-13 DIAGNOSIS — J9611 Chronic respiratory failure with hypoxia: Secondary | ICD-10-CM | POA: Diagnosis not present

## 2023-11-13 DIAGNOSIS — J9612 Chronic respiratory failure with hypercapnia: Secondary | ICD-10-CM

## 2023-11-13 NOTE — Assessment & Plan Note (Signed)
 Referred for [facility] 11/13/2023   She would be extremely marginal candidate for any intervention more aggressive than SBRT  but wanted to hear mor about the lung cancer screening program so referred today  F/u in 1 year, all sooner prn          Each maintenance medication was reviewed in detail including emphasizing most importantly the difference between maintenance and prns and under what circumstances the prns are to be triggered using an action plan format where appropriate.  Total time for H and P, chart review, counseling, reviewing smi/ hfa/neb/ 02/pulse ox  device(s) and generating customized AVS unique to this office visit / same day charting = 32 min

## 2023-11-13 NOTE — Assessment & Plan Note (Signed)
 Quit smoking 06/2022/MM - Spirometry 06/21/21 FEV1 0.69 (28.8%)  Ratio 0.52  - ECHO 05/16/21 c/w cor pulmonale with RVSP  65   - CTa 05/16/21 c/w emphysema  - 02/26/2023  After extensive coaching inhaler device,  effectiveness =    75% with [facility]  - 02/26/23   alpha one AT  MM   level 184 /  EOS 0.1  - 08/15/2023  After extensive coaching inhaler device,  effectiveness =  80% with dpi > start trelelgy 100      Group D (now reclassified as E) in terms of symptom/risk and laba/lama/ICS  therefore appropriate rx at this point >>>  continue trelegy

## 2023-11-13 NOTE — Assessment & Plan Note (Signed)
 Placed on 02 in [facility] 06/2021  - HC03  02/26/2023   = 36 - 02/26/2023   sats RA = 74% required 10lpm to maintain at 90% walking slowly x  50 - 75 ft which is her baseline activity tol   Rec titrate to keep around 90% no higher needed due to hypercarbia

## 2023-11-13 NOTE — Patient Instructions (Addendum)
 No change in medications   Make sure you check your oxygen saturation  AT  your highest level of activity (not after you stop)   to be sure it stays over 90% and adjust  02 flow upward to maintain this level if needed but remember to turn it back to previous settings when you stop (to conserve your supply).    My office will be contacting you by phone for referral to lung cancer screening CT   - if you don't hear back from my office within one week please call us back or notify us thru MyChart and we'll address it right away   Please schedule a follow up visit in 6  months but call sooner if needed

## 2023-11-14 ENCOUNTER — Telehealth: Payer: Self-pay | Admitting: Internal Medicine

## 2023-12-16 ENCOUNTER — Ambulatory Visit: Payer: Self-pay

## 2024-01-05 ENCOUNTER — Inpatient Hospital Stay (HOSPITAL_COMMUNITY)
Admission: EM | Admit: 2024-01-05 | Discharge: 2024-01-08 | DRG: 208 | Disposition: A | Discharge status: Home / Self Care | Attending: Pulmonary Disease | Admitting: Pulmonary Disease

## 2024-01-05 ENCOUNTER — Emergency Department (HOSPITAL_COMMUNITY)

## 2024-01-05 ENCOUNTER — Inpatient Hospital Stay (HOSPITAL_COMMUNITY)

## 2024-01-05 DIAGNOSIS — Z833 Family history of diabetes mellitus: Secondary | ICD-10-CM

## 2024-01-05 DIAGNOSIS — R0602 Shortness of breath: Secondary | ICD-10-CM | POA: Diagnosis present

## 2024-01-05 DIAGNOSIS — J9621 Acute and chronic respiratory failure with hypoxia: Principal | ICD-10-CM | POA: Diagnosis present

## 2024-01-05 DIAGNOSIS — Z681 Body mass index (BMI) 19 or less, adult: Secondary | ICD-10-CM | POA: Diagnosis not present

## 2024-01-05 DIAGNOSIS — R739 Hyperglycemia, unspecified: Secondary | ICD-10-CM | POA: Diagnosis present

## 2024-01-05 DIAGNOSIS — J189 Pneumonia, unspecified organism: Secondary | ICD-10-CM | POA: Diagnosis present

## 2024-01-05 DIAGNOSIS — Z79899 Other long term (current) drug therapy: Secondary | ICD-10-CM

## 2024-01-05 DIAGNOSIS — Z87891 Personal history of nicotine dependence: Secondary | ICD-10-CM | POA: Diagnosis not present

## 2024-01-05 DIAGNOSIS — J9690 Respiratory failure, unspecified, unspecified whether with hypoxia or hypercapnia: Secondary | ICD-10-CM | POA: Diagnosis present

## 2024-01-05 DIAGNOSIS — E43 Unspecified severe protein-calorie malnutrition: Secondary | ICD-10-CM | POA: Diagnosis present

## 2024-01-05 DIAGNOSIS — Z90710 Acquired absence of both cervix and uterus: Secondary | ICD-10-CM | POA: Diagnosis not present

## 2024-01-05 DIAGNOSIS — G9341 Metabolic encephalopathy: Secondary | ICD-10-CM | POA: Diagnosis present

## 2024-01-05 DIAGNOSIS — R64 Cachexia: Secondary | ICD-10-CM | POA: Diagnosis present

## 2024-01-05 DIAGNOSIS — Z7951 Long term (current) use of inhaled steroids: Secondary | ICD-10-CM

## 2024-01-05 DIAGNOSIS — Z1152 Encounter for screening for COVID-19: Secondary | ICD-10-CM | POA: Diagnosis not present

## 2024-01-05 DIAGNOSIS — R0603 Acute respiratory distress: Secondary | ICD-10-CM | POA: Diagnosis not present

## 2024-01-05 DIAGNOSIS — J9620 Acute and chronic respiratory failure, unspecified whether with hypoxia or hypercapnia: Secondary | ICD-10-CM | POA: Diagnosis present

## 2024-01-05 DIAGNOSIS — J441 Chronic obstructive pulmonary disease with (acute) exacerbation: Secondary | ICD-10-CM | POA: Diagnosis present

## 2024-01-05 DIAGNOSIS — Z9981 Dependence on supplemental oxygen: Secondary | ICD-10-CM | POA: Diagnosis not present

## 2024-01-05 DIAGNOSIS — I959 Hypotension, unspecified: Secondary | ICD-10-CM | POA: Diagnosis present

## 2024-01-05 DIAGNOSIS — J9622 Acute and chronic respiratory failure with hypercapnia: Secondary | ICD-10-CM | POA: Diagnosis present

## 2024-01-05 LAB — BLOOD GAS, ARTERIAL
Acid-Base Excess: 21.5 mmol/L — ABNORMAL HIGH (ref 0.0–2.0)
Acid-Base Excess: 14.2 mmol/L — ABNORMAL HIGH (ref 0.0–2.0)
Bicarbonate: 54.1 mmol/L — ABNORMAL HIGH (ref 20.0–28.0)
Bicarbonate: 45.9 mmol/L — ABNORMAL HIGH (ref 20.0–28.0)
Drawn by: 27016
Drawn by: 10555
O2 Saturation: 89 %
O2 Saturation: 89.4 %
Patient temperature: 37
Patient temperature: 37
pCO2 arterial: 110 mmHg (ref 32–48)
pCO2 arterial: 100 mmHg (ref 32–48)
pH, Arterial: 7.3 — ABNORMAL LOW (ref 7.35–7.45)
pH, Arterial: 7.27 — ABNORMAL LOW (ref 7.35–7.45)
pO2, Arterial: 53 mmHg — ABNORMAL LOW (ref 83–108)
pO2, Arterial: 53 mmHg — ABNORMAL LOW (ref 83–108)

## 2024-01-05 LAB — CBC WITH DIFFERENTIAL/PLATELET
Abs Immature Granulocytes: 0.01 10*3/uL (ref 0.00–0.07)
Basophils Absolute: 0 10*3/uL (ref 0.0–0.1)
Basophils Relative: 1 %
Eosinophils Absolute: 0.1 10*3/uL (ref 0.0–0.5)
Eosinophils Relative: 1 %
HCT: 47.9 % — ABNORMAL HIGH (ref 36.0–46.0)
Hemoglobin: 14.3 g/dL (ref 12.0–15.0)
Immature Granulocytes: 0 %
Lymphocytes Relative: 19 %
Lymphs Abs: 1.1 10*3/uL (ref 0.7–4.0)
MCH: 30.2 pg (ref 26.0–34.0)
MCHC: 29.9 g/dL — ABNORMAL LOW (ref 30.0–36.0)
MCV: 101.3 fL — ABNORMAL HIGH (ref 80.0–100.0)
Monocytes Absolute: 0.6 10*3/uL (ref 0.1–1.0)
Monocytes Relative: 11 %
Neutro Abs: 4.1 10*3/uL (ref 1.7–7.7)
Neutrophils Relative %: 68 %
Platelets: 161 10*3/uL (ref 150–400)
RBC: 4.73 MIL/uL (ref 3.87–5.11)
RDW: 12.5 % (ref 11.5–15.5)
WBC: 6 10*3/uL (ref 4.0–10.5)
nRBC: 0 % (ref 0.0–0.2)

## 2024-01-05 LAB — TROPONIN I (HIGH SENSITIVITY): Troponin I (High Sensitivity): 8 ng/L (ref <18)

## 2024-01-05 LAB — COMPREHENSIVE METABOLIC PANEL WITH GFR
ALT: 11 U/L (ref 0–44)
AST: 20 U/L (ref 15–41)
Albumin: 3.9 g/dL (ref 3.5–5.0)
Alkaline Phosphatase: 79 U/L (ref 38–126)
Anion gap: 9 (ref 5–15)
BUN: 9 mg/dL (ref 6–20)
CO2: 44 mmol/L — ABNORMAL HIGH (ref 22–32)
Calcium: 8.8 mg/dL — ABNORMAL LOW (ref 8.9–10.3)
Chloride: 85 mmol/L — ABNORMAL LOW (ref 98–111)
Creatinine, Ser: 0.45 mg/dL (ref 0.44–1.00)
GFR, Estimated: >60 mL/min (ref >60)
Glucose, Bld: 111 mg/dL — ABNORMAL HIGH (ref 70–99)
Potassium: 4.6 mmol/L (ref 3.5–5.1)
Sodium: 138 mmol/L (ref 135–145)
Total Bilirubin: 0.6 mg/dL (ref 0.0–1.2)
Total Protein: 7.7 g/dL (ref 6.5–8.1)

## 2024-01-05 LAB — BLOOD GAS, VENOUS
Acid-Base Excess: 26.2 mmol/L — ABNORMAL HIGH (ref 0.0–2.0)
Bicarbonate: 59.3 mmol/L — ABNORMAL HIGH (ref 20.0–28.0)
Drawn by: 27160
O2 Saturation: 59.6 %
Patient temperature: 37
pCO2, Ven: 115 mmHg (ref 44–60)
pH, Ven: 7.32 (ref 7.25–7.43)
pO2, Ven: 31 mmHg — CL (ref 32–45)

## 2024-01-05 LAB — LACTIC ACID, PLASMA
Lactic Acid, Venous: 0.7 mmol/L (ref 0.5–1.9)
Lactic Acid, Venous: 0.6 mmol/L (ref 0.5–1.9)

## 2024-01-05 LAB — RESP PANEL BY RT-PCR (RSV, FLU A&B, COVID)  RVPGX2
Influenza A by PCR: NEGATIVE
Influenza B by PCR: NEGATIVE
Resp Syncytial Virus by PCR: NEGATIVE
SARS Coronavirus 2 by RT PCR: NEGATIVE

## 2024-01-05 LAB — PROTIME-INR
INR: 1 (ref 0.8–1.2)
Prothrombin Time: 13.4 s (ref 11.4–15.2)

## 2024-01-05 MED ORDER — IPRATROPIUM-ALBUTEROL 0.5-2.5 (3) MG/3ML IN SOLN
3.0000 mL | RESPIRATORY_TRACT | Status: DC
  Administered 2024-01-05: 3 mL via RESPIRATORY_TRACT
  Filled 2024-01-05: qty 3

## 2024-01-05 MED ORDER — ETOMIDATE 2 MG/ML IV SOLN
20.0000 mg | Freq: Once | INTRAVENOUS | Status: AC
  Administered 2024-01-05: 20 mg via INTRAVENOUS
  Filled 2024-01-05: qty 10

## 2024-01-05 MED ORDER — LORAZEPAM 2 MG/ML IJ SOLN: 0.5000 mg | Freq: Four times a day (QID) | INTRAMUSCULAR | Status: DC | PRN

## 2024-01-05 MED ORDER — SODIUM CHLORIDE 0.9 % IV BOLUS
1000.0000 mL | Freq: Once | INTRAVENOUS | Status: AC
  Administered 2024-01-05: 1000 mL via INTRAVENOUS

## 2024-01-05 MED ORDER — FENTANYL 2500MCG IN NS 250ML (10MCG/ML) PREMIX INFUSION
50.0000 ug/h | INTRAVENOUS | Status: DC
  Administered 2024-01-05: 100 ug/h via INTRAVENOUS
  Administered 2024-01-06: 150 ug/h via INTRAVENOUS
  Administered 2024-01-07: 100 ug/h via INTRAVENOUS
  Filled 2024-01-05 (×2): qty 250

## 2024-01-05 MED ORDER — SODIUM CHLORIDE 0.9 % IV SOLN: 1.0000 g | INTRAVENOUS | Status: DC

## 2024-01-05 MED ORDER — ENOXAPARIN SODIUM 40 MG/0.4ML IJ SOSY
40.0000 mg | PREFILLED_SYRINGE | INTRAMUSCULAR | Status: DC
  Administered 2024-01-05: 40 mg via SUBCUTANEOUS
  Filled 2024-01-05: qty 0.4

## 2024-01-05 MED ORDER — SODIUM CHLORIDE 0.9% FLUSH
3.0000 mL | Freq: Two times a day (BID) | INTRAVENOUS | Status: DC
  Administered 2024-01-05 – 2024-01-08 (×6): 3 mL via INTRAVENOUS

## 2024-01-05 MED ORDER — POLYETHYLENE GLYCOL 3350 17 G PO PACK: 17.0000 g | PACK | Freq: Every day | ORAL | Status: DC | PRN

## 2024-01-05 MED ORDER — IPRATROPIUM-ALBUTEROL 0.5-2.5 (3) MG/3ML IN SOLN
3.0000 mL | Freq: Once | RESPIRATORY_TRACT | Status: AC
  Administered 2024-01-05: 3 mL via RESPIRATORY_TRACT
  Filled 2024-01-05: qty 3

## 2024-01-05 MED ORDER — LORAZEPAM 2 MG/ML IJ SOLN
0.5000 mg | Freq: Once | INTRAMUSCULAR | Status: AC
Start: 2024-01-05 — End: 2024-01-05
  Administered 2024-01-05: 0.5 mg via INTRAVENOUS
  Filled 2024-01-05: qty 1

## 2024-01-05 MED ORDER — FENTANYL BOLUS VIA INFUSION
50.0000 ug | INTRAVENOUS | Status: DC | PRN
  Administered 2024-01-06: 50 ug via INTRAVENOUS
  Administered 2024-01-06: 75 ug via INTRAVENOUS
  Administered 2024-01-06: 100 ug via INTRAVENOUS
  Administered 2024-01-06 – 2024-01-07 (×2): 75 ug via INTRAVENOUS

## 2024-01-05 MED ORDER — FENTANYL CITRATE PF 50 MCG/ML IJ SOSY
50.0000 ug | PREFILLED_SYRINGE | INTRAMUSCULAR | Status: DC | PRN
  Administered 2024-01-05: 50 ug via INTRAVENOUS

## 2024-01-05 MED ORDER — IPRATROPIUM BROMIDE 0.02 % IN SOLN
RESPIRATORY_TRACT | Status: AC
  Administered 2024-01-05: 0.5 mg
  Filled 2024-01-05: qty 2.5

## 2024-01-05 MED ORDER — SUCCINYLCHOLINE CHLORIDE 200 MG/10ML IV SOSY
100.0000 mg | PREFILLED_SYRINGE | Freq: Once | INTRAVENOUS | Status: AC
  Administered 2024-01-05: 100 mg via INTRAVENOUS
  Filled 2024-01-05: qty 10

## 2024-01-05 MED ORDER — ACETAMINOPHEN 500 MG PO TABS: 1000.0000 mg | ORAL_TABLET | Freq: Four times a day (QID) | ORAL | Status: DC | PRN

## 2024-01-05 MED ORDER — SODIUM CHLORIDE 0.9 % IV SOLN
500.0000 mg | Freq: Once | INTRAVENOUS | Status: AC
  Administered 2024-01-05: 500 mg via INTRAVENOUS
  Filled 2024-01-05: qty 5

## 2024-01-05 MED ORDER — ONDANSETRON HCL 4 MG/2ML IJ SOLN: 4.0000 mg | Freq: Four times a day (QID) | INTRAMUSCULAR | Status: DC | PRN

## 2024-01-05 MED ORDER — SODIUM CHLORIDE 0.9 % IV SOLN
2.0000 g | Freq: Once | INTRAVENOUS | Status: AC
  Administered 2024-01-05: 2 g via INTRAVENOUS
  Filled 2024-01-05: qty 20

## 2024-01-05 MED ORDER — METHYLPREDNISOLONE SODIUM SUCC 125 MG IJ SOLR
60.0000 mg | Freq: Two times a day (BID) | INTRAMUSCULAR | Status: DC
  Administered 2024-01-06: 60 mg via INTRAVENOUS
  Filled 2024-01-05: qty 2

## 2024-01-05 MED ORDER — ACETAZOLAMIDE SODIUM 500 MG IJ SOLR
500.0000 mg | Freq: Once | INTRAMUSCULAR | Status: DC
  Filled 2024-01-05: qty 500

## 2024-01-05 MED ORDER — CHLORHEXIDINE GLUCONATE CLOTH 2 % EX PADS
6.0000 | MEDICATED_PAD | Freq: Every day | CUTANEOUS | Status: DC
  Administered 2024-01-07 (×2): 6 via TOPICAL

## 2024-01-05 MED ORDER — MELATONIN 3 MG PO TABS: 6.0000 mg | ORAL_TABLET | Freq: Every evening | ORAL | Status: DC | PRN

## 2024-01-05 MED ORDER — PROPOFOL 1000 MG/100ML IV EMUL
0.0000 ug/kg/min | INTRAVENOUS | Status: DC
  Administered 2024-01-05: 5 ug/kg/min via INTRAVENOUS
  Administered 2024-01-06: 20 ug/kg/min via INTRAVENOUS
  Administered 2024-01-06: 50 ug/kg/min via INTRAVENOUS
  Administered 2024-01-07: 40 ug/kg/min via INTRAVENOUS
  Filled 2024-01-05 (×4): qty 100

## 2024-01-05 MED ORDER — ALBUTEROL SULFATE (2.5 MG/3ML) 0.083% IN NEBU
INHALATION_SOLUTION | RESPIRATORY_TRACT | Status: AC
Start: 2024-01-05 — End: 2024-01-05
  Administered 2024-01-05: 5 mg
  Filled 2024-01-05: qty 6

## 2024-01-05 MED ORDER — AZITHROMYCIN 500 MG PO TABS
500.0000 mg | ORAL_TABLET | ORAL | Status: DC
Start: 2024-01-06 — End: 2024-01-08
  Filled 2024-01-05: qty 1

## 2024-01-05 MED ORDER — FENTANYL 2500MCG IN NS 250ML (10MCG/ML) PREMIX INFUSION
INTRAVENOUS | Status: AC
  Filled 2024-01-05: qty 250

## 2024-01-05 MED ORDER — METHYLPREDNISOLONE SODIUM SUCC 125 MG IJ SOLR
125.0000 mg | Freq: Once | INTRAMUSCULAR | Status: AC
  Administered 2024-01-05: 125 mg via INTRAVENOUS
  Filled 2024-01-05: qty 2

## 2024-01-05 MED ORDER — FENTANYL CITRATE PF 50 MCG/ML IJ SOSY
50.0000 ug | PREFILLED_SYRINGE | INTRAMUSCULAR | Status: DC | PRN
  Filled 2024-01-05: qty 1

## 2024-01-05 MED ORDER — LORAZEPAM 2 MG/ML IJ SOLN
0.5000 mg | Freq: Once | INTRAMUSCULAR | Status: AC
  Administered 2024-01-05: 0.5 mg via INTRAVENOUS
  Filled 2024-01-05: qty 1

## 2024-01-05 MED ORDER — ALBUTEROL SULFATE (2.5 MG/3ML) 0.083% IN NEBU
2.5000 mg | INHALATION_SOLUTION | RESPIRATORY_TRACT | Status: DC | PRN
  Administered 2024-01-05: 2.5 mg via RESPIRATORY_TRACT
  Filled 2024-01-05: qty 3

## 2024-01-05 NOTE — ED Notes (Addendum)
 Blood cultures were drawn prior to antibiotics being started, one set drawn by this nurse and the second set drawn by phlebotomist, antibiotics started were Zithromax  and rocephin.

## 2024-01-05 NOTE — Sepsis Progress Note (Signed)
 Elink following code sepsis  Messaged bedside RN she confirmed blood cultures were drawn before abx started.

## 2024-01-05 NOTE — ED Triage Notes (Signed)
 Patient arrived via EMS with complaint for full body tremors, started taking trelegy 3 months ago and the tremors started 3 weeks. On 6L chronically with o2 sats at 87% upon arrival. Cbg with EMS 133.

## 2024-01-05 NOTE — ED Provider Notes (Addendum)
 [place] EMERGENCY DEPARTMENT AT [facility] Provider Note   CSN: [id number] Arrival date & time: 01/05/24  1450     History  Chief Complaint  Patient presents with   Tremors    Joanne Larson is a 63 y.o. female.  Pt is a 63 yo female with pmhx significant for COPD with 6L oxygen via [place] all the time.  Pt said she has developed worsening sob today.  She denies f/c.  She is a poor historian due to sob.       Home Medications Prior to Admission medications   Medication Sig Start Date End Date Taking? Authorizing Provider  albuterol  (PROAIR  HFA) 108 (90 Base) MCG/ACT inhaler 2 puffs every 4 hours as needed only  if your can't catch your breath 08/15/23   Diamond Formica, MD  albuterol  (PROVENTIL ) (2.5 MG/3ML) 0.083% nebulizer solution Take 3 mLs (2.5 mg total) by nebulization every 4 (four) hours as needed for wheezing or shortness of breath. 08/15/23   Diamond Formica, MD  azithromycin  (ZITHROMAX ) 250 MG tablet Take 2 on day one then 1 daily x 4 days Patient not taking: Reported on 11/13/2023 08/15/23   Diamond Formica, MD  diclofenac  (VOLTAREN ) 75 MG EC tablet Take 1 tablet (75 mg total) by mouth 2 (two) times daily. 11/03/16   Idol, Tennelle Taflinger, PA-C  Fluticasone-Umeclidin-Vilant (TRELEGY ELLIPTA ) 100-62.5-25 MCG/ACT AEPB One click each am 08/15/23   Diamond Formica, MD  predniSONE  (DELTASONE ) 10 MG tablet Take  4 each am x 2 days,   2 each am x 2 days,  1 each am x 2 days and stop Patient not taking: Reported on 11/13/2023 08/15/23   Diamond Formica, MD      Allergies    Patient has no known allergies.    Review of Systems   Review of Systems  Respiratory:  Positive for cough, shortness of breath and wheezing.   All other systems reviewed and are negative.   Physical Exam Updated Vital Signs BP (!) 103/93   Pulse 90   Temp 97.8 F (36.6 C) (Axillary)   Resp (!) 21   Ht 5\' 4"  (1.626 m)   Wt 49 kg   SpO2 96%   BMI 18.54 kg/m  Physical Exam Vitals and nursing note  reviewed.  Constitutional:      General: She is in acute distress.     Appearance: Normal appearance. She is ill-appearing.  HENT:     Head: Normocephalic and atraumatic.     Right Ear: External ear normal.     Left Ear: External ear normal.     Nose: Nose normal.     Mouth/Throat:     Mouth: Mucous membranes are moist.     Pharynx: Oropharynx is clear.  Eyes:     Extraocular Movements: Extraocular movements intact.     Conjunctiva/sclera: Conjunctivae normal.     Pupils: Pupils are equal, round, and reactive to light.  Cardiovascular:     Rate and Rhythm: Regular rhythm. Tachycardia present.     Pulses: Normal pulses.     Heart sounds: Normal heart sounds.  Pulmonary:     Effort: Tachypnea, accessory muscle usage and respiratory distress present.     Breath sounds: Wheezing present.     Comments: 87% on 6 L Abdominal:     General: Abdomen is flat. Bowel sounds are normal.     Palpations: Abdomen is soft.  Musculoskeletal:        General:  Normal range of motion.     Cervical back: Normal range of motion and neck supple.  Skin:    General: Skin is warm.     Capillary Refill: Capillary refill takes less than 2 seconds.  Neurological:     General: No focal deficit present.     Mental Status: She is alert and oriented to person, place, and time.  Psychiatric:        Mood and Affect: Mood is anxious.        Behavior: Behavior normal.     ED Results / Procedures / Treatments   Labs (all labs ordered are listed, but only abnormal results are displayed) Labs Reviewed  COMPREHENSIVE METABOLIC PANEL WITH GFR - Abnormal; Notable for the following components:      Result Value   Chloride 85 (*)    CO2 44 (*)    Glucose, Bld 111 (*)    Calcium 8.8 (*)    All other components within normal limits  CBC WITH DIFFERENTIAL/PLATELET - Abnormal; Notable for the following components:   HCT 47.9 (*)    MCV 101.3 (*)    MCHC 29.9 (*)    All other components within normal limits   BLOOD GAS, VENOUS - Abnormal; Notable for the following components:   pCO2, Ven 115 (*)    pO2, Ven 31 (*)    Bicarbonate 59.3 (*)    Acid-Base Excess 26.2 (*)    All other components within normal limits  BLOOD GAS, ARTERIAL - Abnormal; Notable for the following components:   pH, Arterial 7.3 (*)    pCO2 arterial 110 (*)    pO2, Arterial 53 (*)    Bicarbonate 54.1 (*)    Acid-Base Excess 21.5 (*)    All other components within normal limits  BLOOD GAS, ARTERIAL - Abnormal; Notable for the following components:   pH, Arterial 7.27 (*)    pCO2 arterial 100 (*)    pO2, Arterial 53 (*)    Bicarbonate 45.9 (*)    Acid-Base Excess 14.2 (*)    All other components within normal limits  RESP PANEL BY RT-PCR (RSV, FLU A&B, COVID)  RVPGX2  CULTURE, BLOOD (ROUTINE X 2)  CULTURE, BLOOD (ROUTINE X 2)  EXPECTORATED SPUTUM ASSESSMENT W GRAM STAIN, RFLX TO RESP C  RESPIRATORY PANEL BY PCR  MRSA NEXT GEN BY PCR, NASAL  LACTIC ACID, PLASMA  LACTIC ACID, PLASMA  PROTIME-INR  URINALYSIS, W/ REFLEX TO CULTURE (INFECTION SUSPECTED)  HIV ANTIBODY (ROUTINE TESTING W REFLEX)  BASIC METABOLIC PANEL WITH GFR  CBC  MAGNESIUM  PHOSPHORUS  BLOOD GAS, VENOUS  TRIGLYCERIDES  TROPONIN I (HIGH SENSITIVITY)    EKG EKG Interpretation Date/Time:  Sunday January 05 2024 15:16:49 EDT Ventricular Rate:  97 PR Interval:  124 QRS Duration:  96 QT Interval:  341 QTC Calculation: 434 R Axis:   100  Text Interpretation: Sinus rhythm Right atrial enlargement ST elevation, consider anterior injury pt is tremulous due to work of breathing No old tracing to compare Confirmed by Sueellen Emery [phone]) on 01/05/2024 3:19:29 PM  Radiology DG Chest Port 1 View Result Date: 01/05/2024 CLINICAL DATA:  Sepsis, tremors EXAM: PORTABLE CHEST 1 VIEW COMPARISON:  11/13/2023 FINDINGS: Single frontal view of the chest demonstrates an unremarkable cardiac silhouette. Background emphysema and scarring is again identified, not  appreciably changed allowing for differences in technique and positioning. No acute airspace disease, effusion, or pneumothorax. No acute bony abnormalities. IMPRESSION: 1. Emphysema and scarring unchanged.  No acute  airspace disease. Electronically Signed   By: Bobbye Burrow M.D.   On: 01/05/2024 18:06    Procedures Date/Time: 01/05/2024 10:59 PM  Performed by: Sueellen Emery, MDOxygen Delivery Method: Ambu bag Preoxygenation: Pre-oxygenation with 100% oxygen Induction Type: Rapid sequence Ventilation: Mask ventilation without difficulty Laryngoscope Size: Glidescope and 3 Tube size: 7.5 mm Number of attempts: 1 Placement Confirmation: ETT inserted through vocal cords under direct vision, Positive ETCO2 and Breath sounds checked- equal and bilateral Secured at: 22 cm Tube secured with: ETT holder Dental Injury: Teeth and Oropharynx as per pre-operative assessment         Medications Ordered in ED Medications  enoxaparin (LOVENOX) injection 40 mg (40 mg Subcutaneous Given 01/05/24 2205)  sodium chloride flush (NS) 0.9 % injection 3 mL (3 mLs Intravenous Given 01/05/24 2205)  acetaminophen  (TYLENOL ) tablet 1,000 mg (has no administration in time range)  melatonin tablet 6 mg (has no administration in time range)  polyethylene glycol (MIRALAX / GLYCOLAX) packet 17 g (has no administration in time range)  ondansetron  (ZOFRAN ) injection 4 mg (has no administration in time range)  ipratropium-albuterol  (DUONEB) 0.5-2.5 (3) MG/3ML nebulizer solution 3 mL (3 mLs Nebulization Given 01/05/24 2148)  albuterol  (PROVENTIL ) (2.5 MG/3ML) 0.083% nebulizer solution 2.5 mg (2.5 mg Nebulization Given 01/05/24 2148)  cefTRIAXone (ROCEPHIN) 1 g in sodium chloride 0.9 % 100 mL IVPB (has no administration in time range)  azithromycin  (ZITHROMAX ) tablet 500 mg (has no administration in time range)  methylPREDNISolone  sodium succinate (SOLU-MEDROL ) 125 mg/2 mL injection 60 mg (has no administration in time  range)  Chlorhexidine Gluconate Cloth 2 % PADS 6 each (0 each Topical Hold 01/05/24 2138)  propofol (DIPRIVAN) 1000 MG/100ML infusion (5 mcg/kg/min  49 kg Intravenous New Bag/Given 01/05/24 2253)  fentaNYL (SUBLIMAZE) injection 50 mcg (has no administration in time range)  fentaNYL (SUBLIMAZE) injection 50-200 mcg (has no administration in time range)  cefTRIAXone (ROCEPHIN) 2 g in sodium chloride 0.9 % 100 mL IVPB (0 g Intravenous Stopped 01/05/24 1611)  azithromycin  (ZITHROMAX ) 500 mg in sodium chloride 0.9 % 250 mL IVPB (0 mg Intravenous Stopped 01/05/24 1639)  methylPREDNISolone  sodium succinate (SOLU-MEDROL ) 125 mg/2 mL injection 125 mg (125 mg Intravenous Given 01/05/24 1524)  ipratropium-albuterol  (DUONEB) 0.5-2.5 (3) MG/3ML nebulizer solution 3 mL (3 mLs Nebulization Given 01/05/24 1546)  LORazepam (ATIVAN) injection 0.5 mg (0.5 mg Intravenous Given 01/05/24 1610)  sodium chloride 0.9 % bolus 1,000 mL (0 mLs Intravenous Stopped 01/05/24 1855)  sodium chloride 0.9 % bolus 1,000 mL (0 mLs Intravenous Stopped 01/05/24 1940)  ipratropium (ATROVENT) 0.02 % nebulizer solution (0.5 mg  Given 01/05/24 1934)  albuterol  (PROVENTIL ) (2.5 MG/3ML) 0.083% nebulizer solution (5 mg  Given 01/05/24 1933)  LORazepam (ATIVAN) injection 0.5 mg (0.5 mg Intravenous Given 01/05/24 1951)  etomidate (AMIDATE) injection 20 mg (20 mg Intravenous Given 01/05/24 2249)  succinylcholine (ANECTINE) syringe 100 mg (100 mg Intravenous Given 01/05/24 2250)    ED Course/ Medical Decision Making/ A&P                                 Medical Decision Making Amount and/or Complexity of Data Reviewed Labs: ordered. Radiology: ordered.  Risk Prescription drug management. Decision regarding hospitalization.   This patient presents to the ED for concern of sob, this involves an extensive number of treatment options, and is a complaint that carries with it a high risk of complications and morbidity.  The differential diagnosis  includes copd, bronchitis, covid/flu/rsv, pna   Co morbidities that complicate the patient evaluation  COPD   Additional history obtained:  Additional history obtained from epic chart review External records from outside source obtained and reviewed including boyfriend   Lab Tests:  I Ordered, and personally interpreted labs.  The pertinent results include:  cbc nl, vbg with ph 7.32, pCO2 115; cmp with CO2 elevated at 44; lactic nl; Repeat ABG after bipap shows pH of 7.3, PCO2 110   Imaging Studies ordered:  I ordered imaging studies including cxr  I independently visualized and interpreted imaging which showed Emphysema and scarring unchanged.  No acute airspace disease.  I agree with the radiologist interpretation   Cardiac Monitoring:  The patient was maintained on a cardiac monitor.  I personally viewed and interpreted the cardiac monitored which showed an underlying rhythm of: st   Medicines ordered and prescription drug management:  I ordered medication including solumedrol/nebs  for sx  Reevaluation of the patient after these medicines showed that the patient improved I have reviewed the patients home medicines and have made adjustments as needed   Test Considered:  ct   Critical Interventions:  bipap   Consultations Obtained:  I requested consultation with the CCM (Dr. Zaida Hertz),  and discussed lab and imaging findings as well as pertinent plan - hold off on intubation as she's compensated.  Give IVFs and acetazolamide.  Pt can be admitted to [facility] or [facility] step down, but does not need ICU. Pt d/w Dr. Segars ([facility]) for admission, but then sx worsened, so she will be admitted to ICU.  Problem List / ED Course:  Acute on chronic resp failure:  bipap started.  pCO2 is elevated, but ph is 7.3, so she is compensated.  Hold off on intubation now.  Pt's resp status has not improved with bipap.  ABG going down a little to 7.27, so I talked to Dr. Zaida Hertz again and he  will take her in the ICU at [facility].  As resp status is worsening, he recommended intubation for transfer.  COPD exac: solumedrol/nebs given Code sepsis:  pt given iv rocephin/zithromax ,  ivfs given.  BPs on chart are reading low.  However, when I recheck it myself, BPs are ok.  No indication for pressors.    Reevaluation:  After the interventions noted above, I reevaluated the patient and found that they have :improved   Social Determinants of Health:  Lives at home   Dispostion:  After consideration of the diagnostic results and the patients response to treatment, I feel that the patent would benefit from admission.  CRITICAL CARE Performed by: Sueellen Emery   Total critical care time: 45 minutes  Critical care time was exclusive of separately billable procedures and treating other patients.  Critical care was necessary to treat or prevent imminent or life-threatening deterioration.  Critical care was time spent personally by me on the following activities: development of treatment plan with patient and/or surrogate as well as nursing, discussions with consultants, evaluation of patient's response to treatment, examination of patient, obtaining history from patient or surrogate, ordering and performing treatments and interventions, ordering and review of laboratory studies, ordering and review of radiographic studies, pulse oximetry and re-evaluation of patient's condition.           Final Clinical Impression(s) / ED Diagnoses Final diagnoses:  Acute on chronic respiratory failure with hypoxia and hypercapnia (HCC)  COPD exacerbation (HCC)    Rx / DC Orders ED Discharge Orders  None         Sueellen Emery, MD 01/05/24 Deberah Falconer    Sueellen Emery, MD 01/05/24 2300

## 2024-01-05 NOTE — Consult Note (Signed)
 Brief [facility] note:   Referral for admit for severe COPD exacerbation, acute on chronic hypoxic and hypercapneic respiratory failure. At time of my evaluation she had recently received Ativan due to poor tolerance of BiPAP. On my evaluation she is obtunded, in mitts, and not currently safe for BiPAP. I have asked for mitts to be removed. Asked that EDP reach out to ICU re: possible direct admit to ICU at [facility] for close monitoring on BiPAP v intubation. If ICU does not want to direct admit, will need continued close monitoring in the ED given her mental status. If she wakes up as Ativan wears off and is safe on BiPAP, may be able to direct admit to stepdown. Currently not safe for that level of care.   Arnulfo Larch, MD  [facility]

## 2024-01-06 DIAGNOSIS — J9622 Acute and chronic respiratory failure with hypercapnia: Secondary | ICD-10-CM | POA: Diagnosis not present

## 2024-01-06 DIAGNOSIS — J441 Chronic obstructive pulmonary disease with (acute) exacerbation: Secondary | ICD-10-CM | POA: Diagnosis not present

## 2024-01-06 DIAGNOSIS — J9621 Acute and chronic respiratory failure with hypoxia: Secondary | ICD-10-CM | POA: Diagnosis not present

## 2024-01-06 DIAGNOSIS — J9620 Acute and chronic respiratory failure, unspecified whether with hypoxia or hypercapnia: Secondary | ICD-10-CM | POA: Diagnosis present

## 2024-01-06 LAB — URINALYSIS, W/ REFLEX TO CULTURE (INFECTION SUSPECTED)
Bacteria, UA: NONE SEEN
Bilirubin Urine: NEGATIVE
Glucose, UA: NEGATIVE mg/dL
Hgb urine dipstick: NEGATIVE
Ketones, ur: NEGATIVE mg/dL
Leukocytes,Ua: NEGATIVE
Nitrite: NEGATIVE
Protein, ur: NEGATIVE mg/dL
Specific Gravity, Urine: 1.011 (ref 1.005–1.030)
pH: 6 (ref 5.0–8.0)

## 2024-01-06 LAB — GLUCOSE, CAPILLARY
Glucose-Capillary: 179 mg/dL — ABNORMAL HIGH (ref 70–99)
Glucose-Capillary: 121 mg/dL — ABNORMAL HIGH (ref 70–99)
Glucose-Capillary: 101 mg/dL — ABNORMAL HIGH (ref 70–99)
Glucose-Capillary: 140 mg/dL — ABNORMAL HIGH (ref 70–99)
Glucose-Capillary: 147 mg/dL — ABNORMAL HIGH (ref 70–99)
Glucose-Capillary: 104 mg/dL — ABNORMAL HIGH (ref 70–99)
Glucose-Capillary: 102 mg/dL — ABNORMAL HIGH (ref 70–99)

## 2024-01-06 LAB — POCT I-STAT 7, (LYTES, BLD GAS, ICA,H+H)
Acid-Base Excess: 12 mmol/L — ABNORMAL HIGH (ref 0.0–2.0)
Acid-Base Excess: 13 mmol/L — ABNORMAL HIGH (ref 0.0–2.0)
Bicarbonate: 39.6 mmol/L — ABNORMAL HIGH (ref 20.0–28.0)
Bicarbonate: 37.8 mmol/L — ABNORMAL HIGH (ref 20.0–28.0)
Calcium, Ion: 1.12 mmol/L — ABNORMAL LOW (ref 1.15–1.40)
Calcium, Ion: 1.11 mmol/L — ABNORMAL LOW (ref 1.15–1.40)
HCT: 40 % (ref 36.0–46.0)
HCT: 39 % (ref 36.0–46.0)
Hemoglobin: 13.6 g/dL (ref 12.0–15.0)
Hemoglobin: 13.3 g/dL (ref 12.0–15.0)
O2 Saturation: 100 %
O2 Saturation: 96 %
Potassium: 4.4 mmol/L (ref 3.5–5.1)
Potassium: 4.2 mmol/L (ref 3.5–5.1)
Sodium: 138 mmol/L (ref 135–145)
Sodium: 137 mmol/L (ref 135–145)
TCO2: 42 mmol/L — ABNORMAL HIGH (ref 22–32)
TCO2: 39 mmol/L — ABNORMAL HIGH (ref 22–32)
pCO2 arterial: 66.5 mmHg (ref 32–48)
pCO2 arterial: 49.9 mmHg — ABNORMAL HIGH (ref 32–48)
pH, Arterial: 7.383 (ref 7.35–7.45)
pH, Arterial: 7.488 — ABNORMAL HIGH (ref 7.35–7.45)
pO2, Arterial: 270 mmHg — ABNORMAL HIGH (ref 83–108)
pO2, Arterial: 77 mmHg — ABNORMAL LOW (ref 83–108)

## 2024-01-06 LAB — CBC
HCT: 41.2 % (ref 36.0–46.0)
HCT: 41 % (ref 36.0–46.0)
Hemoglobin: 12.5 g/dL (ref 12.0–15.0)
Hemoglobin: 12.5 g/dL (ref 12.0–15.0)
MCH: 30.1 pg (ref 26.0–34.0)
MCH: 30.4 pg (ref 26.0–34.0)
MCHC: 30.3 g/dL (ref 30.0–36.0)
MCHC: 30.5 g/dL (ref 30.0–36.0)
MCV: 99.3 fL (ref 80.0–100.0)
MCV: 99.8 fL (ref 80.0–100.0)
Platelets: 137 10*3/uL — ABNORMAL LOW (ref 150–400)
Platelets: 140 10*3/uL — ABNORMAL LOW (ref 150–400)
RBC: 4.15 MIL/uL (ref 3.87–5.11)
RBC: 4.11 MIL/uL (ref 3.87–5.11)
RDW: 12.5 % (ref 11.5–15.5)
RDW: 12.5 % (ref 11.5–15.5)
WBC: 3.4 10*3/uL — ABNORMAL LOW (ref 4.0–10.5)
WBC: 3.7 10*3/uL — ABNORMAL LOW (ref 4.0–10.5)
nRBC: 0 % (ref 0.0–0.2)
nRBC: 0 % (ref 0.0–0.2)

## 2024-01-06 LAB — BASIC METABOLIC PANEL WITH GFR
Anion gap: 11 (ref 5–15)
Anion gap: 13 (ref 5–15)
BUN: 10 mg/dL (ref 6–20)
BUN: 13 mg/dL (ref 6–20)
CO2: 34 mmol/L — ABNORMAL HIGH (ref 22–32)
CO2: 31 mmol/L (ref 22–32)
Calcium: 8.5 mg/dL — ABNORMAL LOW (ref 8.9–10.3)
Calcium: 8.6 mg/dL — ABNORMAL LOW (ref 8.9–10.3)
Chloride: 94 mmol/L — ABNORMAL LOW (ref 98–111)
Chloride: 94 mmol/L — ABNORMAL LOW (ref 98–111)
Creatinine, Ser: 0.56 mg/dL (ref 0.44–1.00)
Creatinine, Ser: 0.68 mg/dL (ref 0.44–1.00)
GFR, Estimated: >60 mL/min (ref >60)
GFR, Estimated: >60 mL/min (ref >60)
Glucose, Bld: 162 mg/dL — ABNORMAL HIGH (ref 70–99)
Glucose, Bld: 102 mg/dL — ABNORMAL HIGH (ref 70–99)
Potassium: 4.3 mmol/L (ref 3.5–5.1)
Potassium: 4.2 mmol/L (ref 3.5–5.1)
Sodium: 139 mmol/L (ref 135–145)
Sodium: 138 mmol/L (ref 135–145)

## 2024-01-06 LAB — BLOOD GAS, VENOUS
Acid-Base Excess: 14.9 mmol/L — ABNORMAL HIGH (ref 0.0–2.0)
Bicarbonate: 41 mmol/L — ABNORMAL HIGH (ref 20.0–28.0)
O2 Saturation: 92.7 %
Patient temperature: 37
pCO2, Ven: 55 mmHg (ref 44–60)
pH, Ven: 7.48 — ABNORMAL HIGH (ref 7.25–7.43)
pO2, Ven: 53 mmHg — ABNORMAL HIGH (ref 32–45)

## 2024-01-06 LAB — HIV ANTIBODY (ROUTINE TESTING W REFLEX): HIV Screen 4th Generation wRfx: NONREACTIVE

## 2024-01-06 LAB — MAGNESIUM
Magnesium: 1.6 mg/dL — ABNORMAL LOW (ref 1.7–2.4)
Magnesium: 1.8 mg/dL (ref 1.7–2.4)

## 2024-01-06 LAB — PHOSPHORUS
Phosphorus: 2.2 mg/dL — ABNORMAL LOW (ref 2.5–4.6)
Phosphorus: 2 mg/dL — ABNORMAL LOW (ref 2.5–4.6)

## 2024-01-06 LAB — TRIGLYCERIDES: Triglycerides: 40 mg/dL (ref <150)

## 2024-01-06 LAB — HEMOGLOBIN A1C
Hgb A1c MFr Bld: 5.1 % (ref 4.8–5.6)
Mean Plasma Glucose: 99.67 mg/dL

## 2024-01-06 LAB — MRSA NEXT GEN BY PCR, NASAL: MRSA by PCR Next Gen: NOT DETECTED

## 2024-01-06 MED ORDER — DOCUSATE SODIUM 50 MG/5ML PO LIQD: 100.0000 mg | Freq: Two times a day (BID) | ORAL | Status: DC | PRN

## 2024-01-06 MED ORDER — REVEFENACIN 175 MCG/3ML IN SOLN
175.0000 ug | Freq: Every day | RESPIRATORY_TRACT | Status: DC
  Administered 2024-01-06 – 2024-01-08 (×3): 175 ug via RESPIRATORY_TRACT
  Filled 2024-01-06 (×3): qty 3

## 2024-01-06 MED ORDER — SODIUM CHLORIDE 0.9 % IV SOLN
2.0000 g | INTRAVENOUS | Status: DC
  Administered 2024-01-06 – 2024-01-07 (×2): 2 g via INTRAVENOUS
  Filled 2024-01-06 (×2): qty 20

## 2024-01-06 MED ORDER — FAMOTIDINE IN NACL 20-0.9 MG/50ML-% IV SOLN
20.0000 mg | Freq: Two times a day (BID) | INTRAVENOUS | Status: DC
  Administered 2024-01-06: 20 mg via INTRAVENOUS
  Filled 2024-01-06: qty 50

## 2024-01-06 MED ORDER — THIAMINE MONONITRATE 100 MG PO TABS
100.0000 mg | ORAL_TABLET | Freq: Every day | ORAL | Status: DC
  Administered 2024-01-06: 100 mg
  Filled 2024-01-06: qty 1

## 2024-01-06 MED ORDER — AZITHROMYCIN 500 MG PO TABS
500.0000 mg | ORAL_TABLET | ORAL | Status: DC
  Filled 2024-01-06: qty 1

## 2024-01-06 MED ORDER — ALBUTEROL SULFATE (2.5 MG/3ML) 0.083% IN NEBU: 2.5000 mg | INHALATION_SOLUTION | RESPIRATORY_TRACT | Status: DC | PRN

## 2024-01-06 MED ORDER — NOREPINEPHRINE 4 MG/250ML-% IV SOLN
2.0000 ug/min | INTRAVENOUS | Status: DC
  Administered 2024-01-06: 2 ug/min via INTRAVENOUS
  Filled 2024-01-06: qty 250

## 2024-01-06 MED ORDER — MAGNESIUM SULFATE 2 GM/50ML IV SOLN
2.0000 g | Freq: Once | INTRAVENOUS | Status: AC
  Administered 2024-01-06: 2 g via INTRAVENOUS
  Filled 2024-01-06: qty 50

## 2024-01-06 MED ORDER — SODIUM PHOSPHATES 45 MMOLE/15ML IV SOLN
15.0000 mmol | Freq: Once | INTRAVENOUS | Status: AC
  Administered 2024-01-06: 15 mmol via INTRAVENOUS
  Filled 2024-01-06: qty 5

## 2024-01-06 MED ORDER — HEPARIN SODIUM (PORCINE) 5000 UNIT/ML IJ SOLN
5000.0000 [IU] | Freq: Three times a day (TID) | INTRAMUSCULAR | Status: DC
  Administered 2024-01-06 – 2024-01-08 (×7): 5000 [IU] via SUBCUTANEOUS
  Filled 2024-01-06 (×7): qty 1

## 2024-01-06 MED ORDER — PROSOURCE TF20 ENFIT COMPATIBL EN LIQD
60.0000 mL | Freq: Every day | ENTERAL | Status: DC
  Administered 2024-01-06: 60 mL
  Filled 2024-01-06: qty 60

## 2024-01-06 MED ORDER — BUDESONIDE 0.5 MG/2ML IN SUSP
0.5000 mg | Freq: Two times a day (BID) | RESPIRATORY_TRACT | Status: DC
  Administered 2024-01-06 – 2024-01-08 (×5): 0.5 mg via RESPIRATORY_TRACT
  Filled 2024-01-06 (×5): qty 2

## 2024-01-06 MED ORDER — SODIUM CHLORIDE 0.9 % IV SOLN
500.0000 mg | INTRAVENOUS | Status: AC
  Administered 2024-01-06 – 2024-01-07 (×2): 500 mg via INTRAVENOUS
  Filled 2024-01-06 (×2): qty 5

## 2024-01-06 MED ORDER — IPRATROPIUM-ALBUTEROL 0.5-2.5 (3) MG/3ML IN SOLN
3.0000 mL | Freq: Four times a day (QID) | RESPIRATORY_TRACT | Status: DC
  Administered 2024-01-06 (×2): 3 mL via RESPIRATORY_TRACT
  Filled 2024-01-06 (×2): qty 3

## 2024-01-06 MED ORDER — METHYLPREDNISOLONE SODIUM SUCC 40 MG IJ SOLR
40.0000 mg | INTRAMUSCULAR | Status: DC
  Administered 2024-01-07: 40 mg via INTRAVENOUS
  Filled 2024-01-06: qty 1

## 2024-01-06 MED ORDER — OSMOLITE 1.2 CAL PO LIQD
1000.0000 mL | ORAL | Status: DC
  Administered 2024-01-06 (×2): 1000 mL
  Filled 2024-01-06 (×3): qty 1000

## 2024-01-06 MED ORDER — FAMOTIDINE 20 MG PO TABS
20.0000 mg | ORAL_TABLET | Freq: Two times a day (BID) | ORAL | Status: DC
  Administered 2024-01-06 (×2): 20 mg
  Filled 2024-01-06 (×2): qty 1

## 2024-01-06 MED ORDER — ORAL CARE MOUTH RINSE: 15.0000 mL | OROMUCOSAL | Status: DC | PRN

## 2024-01-06 MED ORDER — VITAL HIGH PROTEIN PO LIQD: 1000.0000 mL | ORAL | Status: DC

## 2024-01-06 MED ORDER — ARFORMOTEROL TARTRATE 15 MCG/2ML IN NEBU
15.0000 ug | INHALATION_SOLUTION | Freq: Two times a day (BID) | RESPIRATORY_TRACT | Status: DC
  Administered 2024-01-06 – 2024-01-08 (×5): 15 ug via RESPIRATORY_TRACT
  Filled 2024-01-06 (×5): qty 2

## 2024-01-06 MED ORDER — LACTATED RINGERS IV BOLUS
750.0000 mL | Freq: Once | INTRAVENOUS | Status: AC
  Administered 2024-01-06: 750 mL via INTRAVENOUS

## 2024-01-06 MED ORDER — INSULIN ASPART 100 UNIT/ML IJ SOLN
0.0000 [IU] | INTRAMUSCULAR | Status: DC
  Administered 2024-01-06 (×3): 2 [IU] via SUBCUTANEOUS

## 2024-01-06 MED ORDER — SODIUM CHLORIDE 0.9 % IV SOLN
250.0000 mL | INTRAVENOUS | Status: AC
  Administered 2024-01-06: 250 mL via INTRAVENOUS

## 2024-01-06 MED ORDER — ORAL CARE MOUTH RINSE
15.0000 mL | OROMUCOSAL | Status: DC
  Administered 2024-01-06 – 2024-01-07 (×16): 15 mL via OROMUCOSAL

## 2024-01-06 MED ORDER — ONDANSETRON HCL 4 MG/2ML IJ SOLN: 4.0000 mg | Freq: Four times a day (QID) | INTRAMUSCULAR | Status: DC | PRN

## 2024-01-06 MED ORDER — DOCUSATE SODIUM 100 MG PO CAPS: 100.0000 mg | ORAL_CAPSULE | Freq: Two times a day (BID) | ORAL | Status: DC | PRN

## 2024-01-06 NOTE — Progress Notes (Addendum)
   NAME:  CAMARON DEBERNARDI, MRN:  [id number], DOB:  [birth year], LOS: 1 ADMISSION DATE:  01/05/2024, CONSULTATION DATE:  01/06/2024 REFERRING MD:  Sueellen Emery, MD, CHIEF COMPLAINT:  COPD exacerbation   History of Present Illness:  63 y/o female with Stage IV COPD on 6 liters home O2, Chronic respiratory failure.  She presented to [facility] with SOB x 1 day.  In the ED her PH 7.32, PCO2 115 on VBG and PCO2 110 on ABG.  Bicarb on chemistry 44 suggesting chronic CO2 retention. She was intubated at [facility] and transferred to [facility].  Cxr showing severe hyperinflation and no infiltrates.  Pertinent  Medical History  COPD stage IV Chronic respiratory failure  Significant Hospital Events: Including procedures, antibiotic start and stop dates in addition to other pertinent events   4/28: COPD exacerbation/intubated and transferred to [facility] from [facility]  Interim History / Subjective:  Somewhat tremulous and agitated still on full ventilator support  Objective   Blood pressure (!) 86/48, pulse 86, temperature 98.7 F (37.1 C), temperature source Oral, resp. rate (!) 24, height 5\' 4"  (1.626 m), weight 49 kg, SpO2 96%.    Vent Mode: PRVC FiO2 (%):  [40 %-60 %] 40 % Set Rate:  [24 bmp-26 bmp] 24 bmp Vt Set:  [430 mL] 430 mL PEEP:  [5 cmH20] 5 cmH20 Pressure Support:  [10 cmH20-20 cmH20] 20 cmH20   Intake/Output Summary (Last 24 hours) at 01/06/2024 0759 Last data filed at 01/06/2024 0600 Gross per 24 hour  Intake 355.01 ml  Output 600 ml  Net -244.99 ml   Filed Weights   01/05/24 1536  Weight: 49 kg    Examination: General this is a chronically ill-appearing 63 year old female she is sedated on propofol and fentanyl still tremulous and fidgety in the bed  HEENT normal cephalic atraumatic orally intubated copious oral secretions no JVD pupils equal reactive  Pulmonary bilateral rales, wheezing on the left, no current accessory use.  Arterial blood gas on current ventilator settings reviewed PCXR: Endotracheal  tubes in satisfactory positioning.  There is severe hyper inflation, there is some interstitial appearing disease  Cardiac regular rate and rhythm without murmur rub or gallop Abdomen soft not tender Extremities are warm and dry with brisk capillary refill and no edema Neuro sedated on propofol and fentanyl, no focal deficits, but not currently following commands GU clear yellow  Resolved Hospital Problem list   N/a  Assessment & Plan:  Acute on chronic hypercapnic and hypoxic respiratory failure secondary to acute exacerbation of chronic obstructive pulmonary disease COVID, influenza, and RSV negative Looking at her chemistry it appears her baseline pCO2 is in the low to mid 60s Plan Continuing full ventilator support, decreasing minute ventilation, currently over ventilating her given her baseline pCO2 is in the mid 60s PAD protocol with RASS goal of -2 Scheduled bronchodilators IV systemic steroids Day #2 azithromycin   ceftriaxone F/u RVP Acute metabolic encephalopathy secondary to hypercarbia Plan Supportive care PAD protocol as mentioned above  Intermittent fluid and electrolyte imbalance Plan Follow-up pending chemistries  Hyperglycemia Plan Sliding scale insulin Goal glucose 140-180 Best Practice (right click and "Reselect all SmartList Selections" daily)   Diet/type: NPO w/ meds via tube DVT prophylaxis LMWH Pressure ulcer(s): N/A GI prophylaxis: H2B Lines: N/A Foley:  Yes, and it is still needed Code Status:  full code      My cct 34 min

## 2024-01-06 NOTE — Progress Notes (Signed)
Unable to obtain sputum culture at this time.

## 2024-01-06 NOTE — Progress Notes (Signed)
 Sputum sample collected and sent to the lab.

## 2024-01-06 NOTE — TOC CM/SW Note (Signed)
 Transition of Care [facility]) - Inpatient Brief Assessment   Patient Details  Name: Joanne Larson MRN: [id number] Date of Birth: 08/14/1961  Transition of Care [facility]) CM/SW Contact:    Tom-Johnson, Angelique Ken, RN Phone Number: 01/06/2024, 2:03 PM   Clinical Narrative:  Patient transferred from [facility] with COPD Exacerbation, currently intubated and sedated. Patient on 6L O2 baseline. Noted patient started taking Trelegy 3 months ago and the tremors started 3 weeks ago. Patient on IV abx, Neb tx.  Patient not Medically ready for discharge.  CM will continue to follow as patient progresses with care towards discharge.       Transition of Care Asessment:

## 2024-01-06 NOTE — H&P (Signed)
 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  She presented to [facility] with SOB x 1 day.  In the ED her PH 7.32, PCO2 115 on VBG and PCO2 110 on ABG.  Bicarb on chemistry 44 suggesting chronic CO2 retention. She was intubated at [facility] and transferred to [facility].  Cxr showing severe hyperinflation and no infiltrates.  Pertinent  Medical History  COPD stage IV Chronic respiratory failure  Significant Hospital Events: Including procedures, antibiotic start and stop dates in addition to other pertinent events   4/28: COPD exacerbation/intubated and transferred to [facility] from [facility]  Interim History / Subjective:  N/a  Objective   Blood pressure 106/83, pulse 100, temperature 98.6 F (37 C), temperature source Axillary, resp. rate (!) 25, height 5\' 4"  (1.626 m), weight 49 kg, SpO2 100%.    Vent Mode: PRVC FiO2 (%):  [40 %-60 %] 40 % Set Rate:  [24 bmp-26 bmp] 24 bmp Vt Set:  [430 mL] 430 mL PEEP:  [5 cmH20] 5 cmH20 Pressure Support:  [10 cmH20-20 cmH20] 20 cmH20   Intake/Output Summary (Last 24 hours) at 01/06/2024 0136 Last data filed at 01/05/2024 1611 Gross per 24 hour  Intake 100 ml  Output --  Net 100 ml   Filed Weights   01/05/24 1536  Weight: 49 kg    Examination: General: sedated and intubated HENT: PERRLA, no icterus but injected sclerae Lungs: CTA b/l no wheezes no rales Cardiovascular: reg s1s2 no murmurs no gallops Abdomen: soft nt nd bs pos, no guarding Extremities: no cyanosis, clubbing or edema Neuro: sedated and intubated   Resolved Hospital Problem list   N/a  Assessment & Plan:  Acute on chronic hypercapnic and hypoxic respiratory failure  -Vent management  -Monitor O2 sats and ABG's  -daily wean  trials COPD exacerbation  -Nebs, O2 and Steroids  -Pulmonary f/u as outpatient  -Pulmonary rehab as outpatient Hypercapnia  -nebs/steroids and O2  -monitor PCO2 Best Practice (right click and "Reselect all SmartList Selections" daily)   Diet/type: NPO w/ meds via tube DVT prophylaxis LMWH Pressure ulcer(s): N/A GI prophylaxis: H2B Lines: N/A Foley:  Yes, and it is still needed Code Status:  full code   Labs   CBC: Recent Labs  Lab 01/05/24 1539 01/06/24 0122  WBC 6.0  --   NEUTROABS 4.1  --   HGB 14.3 13.6  HCT 47.9* 40.0  MCV 101.3*  --   PLT 161  --     Basic Metabolic Panel: Recent Labs  Lab 01/05/24 1539 01/06/24 0122  NA 138 138  K 4.6 4.4  CL 85*  --   CO2 44*  --   GLUCOSE 111*  --   BUN 9  --   CREATININE 0.45  --   CALCIUM 8.8*  --    GFR: Estimated Creatinine Clearance: 57.8 mL/min (by C-G formula based on SCr of 0.45 mg/dL). Recent Labs  Lab 01/05/24 1539 01/05/24 1721  WBC 6.0  --   LATICACIDVEN 0.7 0.6    Liver Function Tests: Recent Labs  Lab 01/05/24 1539  AST 20  ALT 11  ALKPHOS 79  BILITOT 0.6  PROT 7.7  ALBUMIN 3.9   No results for input(s): "LIPASE", "AMYLASE" in the  last 168 hours. No results for input(s): "AMMONIA" in the last 168 hours.  ABG    Component Value Date/Time   PHART 7.383 01/06/2024 0122   PCO2ART 66.5 (HH) 01/06/2024 0122   PO2ART 270 (H) 01/06/2024 0122   HCO3 39.6 (H) 01/06/2024 0122   TCO2 42 (H) 01/06/2024 0122   O2SAT 100 01/06/2024 0122     Coagulation Profile: Recent Labs  Lab 01/05/24 1539  INR 1.0    Cardiac Enzymes: No results for input(s): "CKTOTAL", "CKMB", "CKMBINDEX", "TROPONINI" in the last 168 hours.  HbA1C: No results found for: "HGBA1C"  CBG: Recent Labs  Lab 01/06/24 0048  GLUCAP 179*    Review of Systems:   Intubated and sedated unable to perform  Past Medical History:  She,  has no past medical history on file.   Surgical History:   Past Surgical  History:  Procedure Laterality Date   ABDOMINAL HYSTERECTOMY     CESAREAN SECTION       Social History:   reports that she quit smoking about 18 months ago. Her smoking use included cigarettes. She started smoking about 21 years ago. She has a 20 pack-year smoking history. She has never used smokeless tobacco. She reports that she does not drink alcohol and does not use drugs.   Family History:  Her family history includes Diabetes in her mother.   Allergies No Known Allergies   Home Medications  Prior to Admission medications   Medication Sig Start Date End Date Taking? Authorizing Provider  albuterol  (PROAIR  HFA) 108 (90 Base) MCG/ACT inhaler 2 puffs every 4 hours as needed only  if your can't catch your breath 08/15/23   Diamond Formica, MD  albuterol  (PROVENTIL ) (2.5 MG/3ML) 0.083% nebulizer solution Take 3 mLs (2.5 mg total) by nebulization every 4 (four) hours as needed for wheezing or shortness of breath. 08/15/23   Diamond Formica, MD  azithromycin  (ZITHROMAX ) 250 MG tablet Take 2 on day one then 1 daily x 4 days Patient not taking: Reported on 11/13/2023 08/15/23   Diamond Formica, MD  diclofenac  (VOLTAREN ) 75 MG EC tablet Take 1 tablet (75 mg total) by mouth 2 (two) times daily. 11/03/16   Idol, Julie, PA-C  Fluticasone-Umeclidin-Vilant (TRELEGY ELLIPTA ) 100-62.5-25 MCG/ACT AEPB One click each am 08/15/23   Diamond Formica, MD  predniSONE  (DELTASONE ) 10 MG tablet Take  4 each am x 2 days,   2 each am x 2 days,  1 each am x 2 days and stop Patient not taking: Reported on 11/13/2023 08/15/23   Diamond Formica, MD     Critical care time: 63   The patient is critically ill with multiple organ system failure and requires high complexity decision making for assessment and support, frequent evaluation and titration of therapies, advanced monitoring, review of radiographic studies and interpretation of complex data.   Critical Care Time devoted to patient care services, exclusive of  separately billable procedures, described in this note is 35 minutes.   Claven Cumming, MD [facility] See Amion for pager  If no response to pager , please call [phone] until 7pm After 7:00 pm call Elink  [phone] 01/06/2024, 1:36 AM    Cachexia and malnutrition secondary to severe COPD and chronic respiratory failure

## 2024-01-06 NOTE — Progress Notes (Signed)
 RT attempted to obtain sputum today without success.

## 2024-01-06 NOTE — Progress Notes (Signed)
 SBP 90s w/ MAP < 65 Got 750 ml LR bolus  Remained boarderline hypotensive so placed on NE infusion   Hadley Leu ACNP-BC [facility] Pulmonary/Critical Care Pager # [phone] OR # [phone] if no answer

## 2024-01-06 NOTE — Progress Notes (Signed)
 Initial Nutrition Assessment  DOCUMENTATION CODES:  Severe malnutrition in context of chronic illness  INTERVENTION:  Initiate tube feeding via OGT: Start Osmolite 1.2 at 20 ml/h and advance by 10ml q12h to goal rate of 23ml/hr  (1200 ml per day) Provides 1440 kcal, 67 gm protein, 984 ml free water daily  Monitor magnesium and phosphorus daily x 4 occurrences, MD to replete as needed, as pt is at risk for refeeding syndrome given severe malnutrition  Thiamine 100mg  daily x5 days   NUTRITION DIAGNOSIS:  Severe Malnutrition related to chronic illness (Stage IV COPD) as evidenced by severe fat depletion, severe muscle depletion.  GOAL:  Patient will meet greater than or equal to 90% of their needs  MONITOR:  Vent status, Labs, Weight trends, TF tolerance  REASON FOR ASSESSMENT:  Ventilator, Consult Enteral/tube feeding initiation and management  ASSESSMENT:  Pt admitted with COPD exacerbation. PMH significant for stage IV COPD, chronic respiratory failure.   4/28: admitted, intubated d/t respiratory failure  Patient is currently intubated on ventilator support MV: 7.4 L/min Temp (24hrs), Avg:98.4 F (36.9 C), Min:97.8 F (36.6 C), Max:98.7 F (37.1 C)  No family present at time of visit. Pt unable to provide nutrition related history. RN present at bedside.   Pt discussed in rounds.  Initiating TF today via OGT.  Unfortunately, there is limited weight history on file to review within the last year to assess for any significant weight changes. Last documented weight on file was 49 kg on admission however suspect this to be a stated weight.   Drains/lines: UOP: x12 hours +111ml x7 hours  Medications: pepcid, SSI 0-15 units q4h, solu-medrol , IV abx Drips: Propofol @ 8.18ml/hr (provides 233kcal from lipids at current rate)  Labs:  Phos 2.2 -repletion ordered Mg 1.6 - repletion ordered CBG's 101-179  NUTRITION - FOCUSED PHYSICAL EXAM: Flowsheet Row Most  Recent Value  Orbital Region Severe depletion  Upper Arm Region Severe depletion  Thoracic and Lumbar Region Severe depletion  Buccal Region Unable to assess  Temple Region Mild depletion  Clavicle Bone Region Severe depletion  Clavicle and Acromion Bone Region Severe depletion  Scapular Bone Region Severe depletion  Dorsal Hand Mild depletion  Patellar Region Severe depletion  Anterior Thigh Region Severe depletion  Posterior Calf Region Severe depletion  Edema (RD Assessment) None  Hair Reviewed  Eyes Unable to assess  Mouth Unable to assess  Skin Reviewed  Nails Reviewed       Diet Order:   Diet Order             Diet NPO time specified Except for: Sips with Meds  Diet effective now                   EDUCATION NEEDS:  Not appropriate for education at this time  Skin:  Skin Assessment: Reviewed RN Assessment  Last BM:  unknown/PTA  Height:  Ht Readings from Last 1 Encounters:  01/05/24 5\' 4"  (1.626 m)    Weight:  Wt Readings from Last 1 Encounters:  01/05/24 49 kg   BMI:  Body mass index is 18.54 kg/m.  Estimated Nutritional Needs:   Kcal:  1400-1600  Protein:  65-80g  Fluid:  >/=1.5L  Joanne Larson, RDN, LDN Clinical Nutrition See AMiON for contact information.

## 2024-01-06 NOTE — Progress Notes (Addendum)
 eLink Physician-Brief Progress Note Patient Name: Joanne Larson DOB: [birth year] MRN: [id number]   Date of Service  01/06/2024  HPI/Events of Note  62/F with hx of chronic respiratory failure on 6L O2, COPD, presenting at [facility] due to worsening shortness of breath.  Pt was not improving on BIPAP so she was subsequently intubated and transferred to [facility].   BP 125/73, HR 92, RR 21, O2 sats 100%   eICU Interventions  Continue IV steroids, empiric antibiotics.  Duonebs around the clock. Follow up cultures.  Check respiratory viral panel.  Pt is sedated on propofol and fentanyl. Repeat ABG.  Famotidine for GI prophylaxis.  Lovenox for DVT prophylaxis.      Intervention Category Evaluation Type: New Patient Evaluation  [name] 01/06/2024, 12:58 AM  2:52 AM ABG 7.383/66.5/270.  Plan> Titrate FiO2.

## 2024-01-07 ENCOUNTER — Inpatient Hospital Stay (HOSPITAL_COMMUNITY)

## 2024-01-07 DIAGNOSIS — R0603 Acute respiratory distress: Secondary | ICD-10-CM | POA: Diagnosis not present

## 2024-01-07 DIAGNOSIS — J9622 Acute and chronic respiratory failure with hypercapnia: Secondary | ICD-10-CM | POA: Diagnosis not present

## 2024-01-07 DIAGNOSIS — J441 Chronic obstructive pulmonary disease with (acute) exacerbation: Secondary | ICD-10-CM | POA: Diagnosis not present

## 2024-01-07 DIAGNOSIS — J9621 Acute and chronic respiratory failure with hypoxia: Secondary | ICD-10-CM | POA: Diagnosis not present

## 2024-01-07 DIAGNOSIS — E43 Unspecified severe protein-calorie malnutrition: Secondary | ICD-10-CM | POA: Insufficient documentation

## 2024-01-07 LAB — CBC WITH DIFFERENTIAL/PLATELET
Abs Immature Granulocytes: 0.04 10*3/uL (ref 0.00–0.07)
Basophils Absolute: 0 10*3/uL (ref 0.0–0.1)
Basophils Relative: 0 %
Eosinophils Absolute: 0 10*3/uL (ref 0.0–0.5)
Eosinophils Relative: 0 %
HCT: 37.6 % (ref 36.0–46.0)
Hemoglobin: 12.1 g/dL (ref 12.0–15.0)
Immature Granulocytes: 0 %
Lymphocytes Relative: 20 %
Lymphs Abs: 2.2 10*3/uL (ref 0.7–4.0)
MCH: 30.7 pg (ref 26.0–34.0)
MCHC: 32.2 g/dL (ref 30.0–36.0)
MCV: 95.4 fL (ref 80.0–100.0)
Monocytes Absolute: 0.6 10*3/uL (ref 0.1–1.0)
Monocytes Relative: 5 %
Neutro Abs: 8.1 10*3/uL — ABNORMAL HIGH (ref 1.7–7.7)
Neutrophils Relative %: 75 %
Platelets: 166 10*3/uL (ref 150–400)
RBC: 3.94 MIL/uL (ref 3.87–5.11)
RDW: 13.2 % (ref 11.5–15.5)
WBC: 10.9 10*3/uL — ABNORMAL HIGH (ref 4.0–10.5)
nRBC: 0 % (ref 0.0–0.2)

## 2024-01-07 LAB — MAGNESIUM: Magnesium: 2.4 mg/dL (ref 1.7–2.4)

## 2024-01-07 LAB — GLUCOSE, CAPILLARY
Glucose-Capillary: 109 mg/dL — ABNORMAL HIGH (ref 70–99)
Glucose-Capillary: 116 mg/dL — ABNORMAL HIGH (ref 70–99)
Glucose-Capillary: 120 mg/dL — ABNORMAL HIGH (ref 70–99)
Glucose-Capillary: 84 mg/dL (ref 70–99)
Glucose-Capillary: 95 mg/dL (ref 70–99)
Glucose-Capillary: 95 mg/dL (ref 70–99)

## 2024-01-07 LAB — RESPIRATORY PANEL BY PCR

## 2024-01-07 LAB — ECHOCARDIOGRAM COMPLETE
Area-P 1/2: 3.68 cm2
Height: 64 in
S' Lateral: 3.1 cm
Weight: 1280.43 [oz_av]

## 2024-01-07 LAB — BASIC METABOLIC PANEL WITH GFR
Anion gap: 7 (ref 5–15)
BUN: 22 mg/dL — ABNORMAL HIGH (ref 6–20)
CO2: 34 mmol/L — ABNORMAL HIGH (ref 22–32)
Calcium: 8.2 mg/dL — ABNORMAL LOW (ref 8.9–10.3)
Chloride: 97 mmol/L — ABNORMAL LOW (ref 98–111)
Creatinine, Ser: 0.52 mg/dL (ref 0.44–1.00)
GFR, Estimated: >60 mL/min (ref >60)
Glucose, Bld: 90 mg/dL (ref 70–99)
Potassium: 4.3 mmol/L (ref 3.5–5.1)
Sodium: 138 mmol/L (ref 135–145)

## 2024-01-07 LAB — PHOSPHORUS: Phosphorus: 4 mg/dL (ref 2.5–4.6)

## 2024-01-07 MED ORDER — INSULIN ASPART 100 UNIT/ML IJ SOLN
0.0000 [IU] | Freq: Three times a day (TID) | INTRAMUSCULAR | Status: DC
  Administered 2024-01-08: 2 [IU] via SUBCUTANEOUS

## 2024-01-07 MED ORDER — FENTANYL CITRATE PF 50 MCG/ML IJ SOSY: 12.5000 ug | PREFILLED_SYRINGE | INTRAMUSCULAR | Status: DC | PRN

## 2024-01-07 MED ORDER — MELATONIN 3 MG PO TABS: 6.0000 mg | ORAL_TABLET | Freq: Every evening | ORAL | Status: DC | PRN

## 2024-01-07 MED ORDER — FAMOTIDINE 20 MG PO TABS
20.0000 mg | ORAL_TABLET | Freq: Two times a day (BID) | ORAL | Status: DC
  Administered 2024-01-07 – 2024-01-08 (×2): 20 mg via ORAL
  Filled 2024-01-07 (×2): qty 1

## 2024-01-07 MED ORDER — MELATONIN 3 MG PO TABS
6.0000 mg | ORAL_TABLET | Freq: Every evening | ORAL | Status: DC | PRN
  Administered 2024-01-07: 6 mg via ORAL
  Filled 2024-01-07: qty 2

## 2024-01-07 MED ORDER — ACETAMINOPHEN 500 MG PO TABS: 1000.0000 mg | ORAL_TABLET | Freq: Four times a day (QID) | ORAL | Status: DC | PRN

## 2024-01-07 MED ORDER — ORAL CARE MOUTH RINSE
15.0000 mL | OROMUCOSAL | Status: DC
  Administered 2024-01-07 – 2024-01-08 (×3): 15 mL via OROMUCOSAL

## 2024-01-07 MED ORDER — DOCUSATE SODIUM 100 MG PO CAPS: 100.0000 mg | ORAL_CAPSULE | Freq: Two times a day (BID) | ORAL | Status: DC | PRN

## 2024-01-07 MED ORDER — THIAMINE MONONITRATE 100 MG PO TABS
100.0000 mg | ORAL_TABLET | Freq: Every day | ORAL | Status: DC
  Administered 2024-01-08: 100 mg via ORAL
  Filled 2024-01-07: qty 1

## 2024-01-07 MED ORDER — DEXMEDETOMIDINE HCL IN NACL 400 MCG/100ML IV SOLN
0.0000 ug/kg/h | INTRAVENOUS | Status: DC
  Administered 2024-01-07: 0.5 ug/kg/h via INTRAVENOUS

## 2024-01-07 MED ORDER — DEXMEDETOMIDINE HCL IN NACL 400 MCG/100ML IV SOLN
0.0000 ug/kg/h | INTRAVENOUS | Status: DC
  Administered 2024-01-07: 0.4 ug/kg/h via INTRAVENOUS
  Filled 2024-01-07: qty 100

## 2024-01-07 MED ORDER — ORAL CARE MOUTH RINSE: 15.0000 mL | OROMUCOSAL | Status: DC | PRN

## 2024-01-07 MED ORDER — PREDNISONE 20 MG PO TABS
40.0000 mg | ORAL_TABLET | Freq: Every day | ORAL | Status: DC
  Administered 2024-01-08: 40 mg via ORAL
  Filled 2024-01-07: qty 2

## 2024-01-07 NOTE — Progress Notes (Signed)
 Looks good Close to baseline Plan Dc foley Dc dex OOB Change solumedrol to pred.  Try autoset BIPAP tonight. Not sure if she will tolerate this but willing to try

## 2024-01-07 NOTE — Plan of Care (Signed)
  Problem: Clinical Measurements: Goal: Ability to maintain clinical measurements within normal limits will improve Outcome: Progressing Goal: Will remain free from infection Outcome: Progressing Goal: Diagnostic test results will improve Outcome: Progressing Goal: Respiratory complications will improve Outcome: Progressing Goal: Cardiovascular complication will be avoided Outcome: Progressing   Problem: Activity: Goal: Risk for activity intolerance will decrease Outcome: Progressing   Problem: Nutrition: Goal: Adequate nutrition will be maintained Outcome: Progressing   Problem: Coping: Goal: Level of anxiety will decrease Outcome: Progressing   Problem: Elimination: Goal: Will not experience complications related to urinary retention Outcome: Progressing   Problem: Safety: Goal: Ability to remain free from injury will improve Outcome: Progressing

## 2024-01-07 NOTE — Progress Notes (Signed)
  Echocardiogram 2D Echocardiogram has been performed.  Dione Franks 01/07/2024, 4:50 PM

## 2024-01-07 NOTE — Plan of Care (Signed)
  Problem: Nutrition: Goal: Adequate nutrition will be maintained Outcome: Progressing   Problem: Pain Managment: Goal: General experience of comfort will improve and/or be controlled Outcome: Progressing   Problem: Safety: Goal: Ability to remain free from injury will improve Outcome: Progressing   Problem: Education: Goal: Knowledge of General Education information will improve Description: Including pain rating scale, medication(s)/side effects and non-pharmacologic comfort measures Outcome: Not Progressing   Problem: Safety: Goal: Non-violent Restraint(s) Outcome: Not Progressing

## 2024-01-07 NOTE — Progress Notes (Signed)
   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  She presented to [facility] with SOB x 1 day.  In the ED her PH 7.32, PCO2 115 on VBG and PCO2 110 on ABG.  Bicarb on chemistry 44 suggesting chronic CO2 retention. She was intubated at [facility] and transferred to [facility].  Cxr showing severe hyperinflation and no infiltrates.  Pertinent  Medical History  COPD stage IV Chronic respiratory failure  Significant Hospital Events: Including procedures, antibiotic start and stop dates in addition to other pertinent events   4/28: COPD exacerbation/intubated and transferred to [facility] from [facility] 4/29 more awake, MS better passed SBT extubated. Sedating gtts stopped  Interim History / Subjective:  Feels better  Objective   Blood pressure 106/63, pulse 88, temperature 98.1 F (36.7 C), temperature source Axillary, resp. rate 18, height 5\' 4"  (1.626 m), weight 29.5 kg, SpO2 (!) 87%.    Vent Mode: PRVC FiO2 (%):  [40 %] 40 % Set Rate:  [18 bmp] 18 bmp Vt Set:  [430 mL] 430 mL PEEP:  [5 cmH20] 5 cmH20 Plateau Pressure:  [16 cmH20] 16 cmH20   Intake/Output Summary (Last 24 hours) at 01/07/2024 0811 Last data filed at 01/07/2024 0600 Gross per 24 hour  Intake 2616.69 ml  Output 950 ml  Net 1666.69 ml   Filed Weights   01/05/24 1536 01/07/24 0500  Weight: 49 kg 29.5 kg    Examination: General appears comfortable on PSV no distress HENT NCAT no JVD orally intubated Pulm some basilar crackles occ wheeze Pcxr ett good position from 4/27 chronic interstitial changes Card rrr Abd soft Ext no sig edema Neuro interactive today. Following commands no distress no focal def  Resolved Hospital Problem list   N/a  Assessment & Plan:  Acute on chronic  hypercapnic and hypoxic respiratory failure secondary to acute exacerbation of chronic obstructive pulmonary disease COVID, influenza, and RSV negative Looking at her chemistry it appears her baseline pCO2 is in the low to mid 60s Plan Passed SBT will extubate to BIPAP and then wean O2 Transitioned to precedex. Wean off  Scheduled bronchodilators IV systemic steroids, day 3 of 5 Day #3 of 3  azithromycin   ceftriaxone day 3/5  F/u RVP (still pending) F/u resp culture   Drug related hypotension Plan Wean for SBP > 90   Acute metabolic encephalopathy secondary to hypercarbia Plan Supportive care Changed to dex and PRN fent RASS goal 0  Intermittent fluid and electrolyte imbalance Plan Trend and replace as indicated  Hyperglycemia Plan Sliding scale insulin Goal glucose 140-180 Best Practice (right click and "Reselect all SmartList Selections" daily)   Diet/type: NPO w/ meds via tube DVT prophylaxis LMWH Pressure ulcer(s): N/A GI prophylaxis: H2B Lines: N/A Foley:  Yes, and it is still needed Code Status:  full code      My cct 39 min

## 2024-01-07 NOTE — Procedures (Signed)
 Extubation Procedure Note  Patient Details:   Name: Joanne Larson DOB: 05/16/1961 MRN: [id number]   Airway Documentation:    Vent end date: 01/07/24 Vent end time: 0925   Evaluation  O2 sats: stable throughout Complications: No apparent complications Patient did tolerate procedure well. Bilateral Breath Sounds: Diminished   Yes  Pt extubated per physician order and placed on bipap. Pt with positive cuff leak and suctioned via ETT/orally. Upon extubation pt able to speak name, give a good cough and no stridor heard. Pt placed on bipap without complication  PS8/+5/40%. RT will continue to monitor and be available as needed.   Wilda Handsome 01/07/2024, 3:37 PM

## 2024-01-08 ENCOUNTER — Other Ambulatory Visit (HOSPITAL_COMMUNITY): Payer: Self-pay

## 2024-01-08 ENCOUNTER — Telehealth: Payer: Self-pay | Admitting: Pulmonary Disease

## 2024-01-08 DIAGNOSIS — J9621 Acute and chronic respiratory failure with hypoxia: Secondary | ICD-10-CM | POA: Diagnosis not present

## 2024-01-08 DIAGNOSIS — J441 Chronic obstructive pulmonary disease with (acute) exacerbation: Secondary | ICD-10-CM | POA: Diagnosis not present

## 2024-01-08 DIAGNOSIS — J9622 Acute and chronic respiratory failure with hypercapnia: Secondary | ICD-10-CM | POA: Diagnosis not present

## 2024-01-08 LAB — CBC WITH DIFFERENTIAL/PLATELET
Abs Immature Granulocytes: 0.01 10*3/uL (ref 0.00–0.07)
Basophils Absolute: 0 10*3/uL (ref 0.0–0.1)
Basophils Relative: 0 %
Eosinophils Absolute: 0 10*3/uL (ref 0.0–0.5)
Eosinophils Relative: 0 %
HCT: 39.4 % (ref 36.0–46.0)
Hemoglobin: 12.3 g/dL (ref 12.0–15.0)
Immature Granulocytes: 0 %
Lymphocytes Relative: 28 %
Lymphs Abs: 2.2 10*3/uL (ref 0.7–4.0)
MCH: 30.4 pg (ref 26.0–34.0)
MCHC: 31.2 g/dL (ref 30.0–36.0)
MCV: 97.3 fL (ref 80.0–100.0)
Monocytes Absolute: 0.4 10*3/uL (ref 0.1–1.0)
Monocytes Relative: 6 %
Neutro Abs: 5.2 10*3/uL (ref 1.7–7.7)
Neutrophils Relative %: 66 %
Platelets: 151 10*3/uL (ref 150–400)
RBC: 4.05 MIL/uL (ref 3.87–5.11)
RDW: 13.2 % (ref 11.5–15.5)
WBC: 7.9 10*3/uL (ref 4.0–10.5)
nRBC: 0 % (ref 0.0–0.2)

## 2024-01-08 LAB — COMPREHENSIVE METABOLIC PANEL WITH GFR
ALT: 24 U/L (ref 0–44)
AST: 34 U/L (ref 15–41)
Albumin: 3.1 g/dL — ABNORMAL LOW (ref 3.5–5.0)
Alkaline Phosphatase: 53 U/L (ref 38–126)
Anion gap: 7 (ref 5–15)
BUN: 13 mg/dL (ref 6–20)
CO2: 36 mmol/L — ABNORMAL HIGH (ref 22–32)
Calcium: 8.2 mg/dL — ABNORMAL LOW (ref 8.9–10.3)
Chloride: 98 mmol/L (ref 98–111)
Creatinine, Ser: 0.62 mg/dL (ref 0.44–1.00)
GFR, Estimated: >60 mL/min (ref >60)
Glucose, Bld: 88 mg/dL (ref 70–99)
Potassium: 4.1 mmol/L (ref 3.5–5.1)
Sodium: 141 mmol/L (ref 135–145)
Total Bilirubin: 0.6 mg/dL (ref 0.0–1.2)
Total Protein: 6 g/dL — ABNORMAL LOW (ref 6.5–8.1)

## 2024-01-08 LAB — PHOSPHORUS: Phosphorus: 4.4 mg/dL (ref 2.5–4.6)

## 2024-01-08 LAB — MAGNESIUM: Magnesium: 2 mg/dL (ref 1.7–2.4)

## 2024-01-08 LAB — GLUCOSE, CAPILLARY
Glucose-Capillary: 95 mg/dL (ref 70–99)
Glucose-Capillary: 129 mg/dL — ABNORMAL HIGH (ref 70–99)
Glucose-Capillary: 120 mg/dL — ABNORMAL HIGH (ref 70–99)

## 2024-01-08 MED ORDER — PREDNISONE 20 MG PO TABS
40.0000 mg | ORAL_TABLET | Freq: Every day | ORAL | 0 refills | Status: DC
  Filled 2024-01-08: qty 2, 1d supply, fill #0

## 2024-01-08 MED ORDER — PREDNISONE 20 MG PO TABS
40.0000 mg | ORAL_TABLET | Freq: Every day | ORAL | 0 refills | Status: DC
  Filled 2024-01-08: qty 1, 1d supply, fill #0

## 2024-01-08 MED ORDER — CEFUROXIME AXETIL 250 MG PO TABS
500.0000 mg | ORAL_TABLET | Freq: Two times a day (BID) | ORAL | Status: DC
  Filled 2024-01-08: qty 2

## 2024-01-08 MED ORDER — HEPARIN SODIUM (PORCINE) 5000 UNIT/ML IJ SOLN: 5000.0000 [IU] | Freq: Two times a day (BID) | INTRAMUSCULAR | Status: DC

## 2024-01-08 MED ORDER — CEFUROXIME AXETIL 500 MG PO TABS
500.0000 mg | ORAL_TABLET | Freq: Two times a day (BID) | ORAL | 0 refills | Status: DC
  Filled 2024-01-08: qty 4, 2d supply, fill #0

## 2024-01-08 NOTE — Progress Notes (Signed)
   01/07/24 2345  BiPAP/CPAP/SIPAP  $ Face Mask Small Yes  BiPAP/CPAP/SIPAP Pt Type Adult  BiPAP/CPAP/SIPAP Resmed  Mask Type Full face mask  Mask Size Small  Respiratory Rate 16 breaths/min  EPAP  (10-20)  Flow Rate 6 lpm  Patient Home Machine No  Patient Home Mask No  Patient Home Tubing No  Auto Titrate Yes  Minimum cmH2O 10 cmH2O  Maximum cmH2O 20 cmH2O  Device Plugged into [employer] Power Outlet Yes

## 2024-01-08 NOTE — Discharge Summary (Signed)
 Physician Discharge Summary       Patient ID: Joanne Larson MRN: [id number] DOB/AGE: [birth year] 63 y.o.  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  She presented to 4/27 [facility] with SOB x 1 day.  In the ED her PH 7.32, PCO2 115 on VBG and PCO2 110 on ABG.  Bicarb on chemistry 44 suggesting chronic CO2 retention. She was intubated at [facility] and transferred to [facility].  CXR showing severe hyperinflation and no infiltrates.  Hospital Course:  She was transferred to [facility] where she was admitted to the medical ICU on the mechanical ventilator. She was treated for COPD exacerbation with nebulized Brovana, Budesonide, Yupelri and systemic glucocorticoids. She was also covered for community acquired pneumonia with azithromycin  and ceftriaxone.  She was able to be extubated 4/29 to BiPAP. Used BiPAP very briefly overnight, but was mostly on her home 6 liters via [place]. 4/30 she is ambulating the unit on her home oxygen and feels/seems to be at her baseline. She did desaturate some in to the 80's with ambulation, which is also in line with her chronic state. She is requesting to go home and is clinically ready.     Discharge Plan by active problems   COPD with acute exacerbation Chronic respiratory failure with hypoxia.  COVID, influenza, RVP, and RSV negative - Resume home Trellegy - Continue 2 additional days of prednisone  then stop - 2 additional days of cefdinir then stop.  - Requesting Pulmonary outpatient follow-up in our [place] office. - Already has oxygen supplies at home    Acute metabolic encephalopathy secondary to hypercarbia - Resolved   Intermittent fluid and  electrolyte imbalance - Resolved   Hyperglycemia- improved with lower steroid dosing - Do not anticipate a home insulin need  Deconditioning - PT evaluated and is recommending Rolator. This has been ordered. .     Significant Hospital tests/ studies  Echo 4/29: LVEF 60-65%, No regional wall motion abnormalities. RV size normal. Mildly elevated PA systolic pressure.    Consults : NA  Discharge Exam: BP (!) 140/72   Pulse 78   Temp 98.7 F (37.1 C) (Oral)   Resp 15   Ht 5\' 4"  (1.626 m)   Wt 34.1 kg   SpO2 95%   BMI 12.90 kg/m   General:  Adult female in NAD Neuro:  Alert, oriented, non-focal HEENT:  [place]/AT, No JVD noted, PERRL Cardiovascular:  RRR, no MRG Lungs:  Clear bilateral breath sounds. Poor air movement in the bases. No wheeze.  Abdomen:  Soft, non-distended, non-tender.  Musculoskeletal:  No acute deformity or ROM limitation.  Skin:  Intact, MMM   Labs at discharge Lab Results  Component Value Date   CREATININE 0.62 01/08/2024   BUN 13 01/08/2024   NA 141 01/08/2024   K 4.1 01/08/2024   CL 98 01/08/2024   CO2 36 (H) 01/08/2024   Lab Results  Component Value Date   WBC 7.9 01/08/2024   HGB 12.3 01/08/2024   HCT 39.4 01/08/2024   MCV 97.3 01/08/2024   PLT 151 01/08/2024   Lab Results  Component Value Date   ALT 24 01/08/2024   AST 34 01/08/2024   ALKPHOS 53 01/08/2024  BILITOT 0.6 01/08/2024   Lab Results  Component Value Date   INR 1.0 01/05/2024    Current radiology studies ECHOCARDIOGRAM COMPLETE Result Date: 01/07/2024    ECHOCARDIOGRAM REPORT   Patient Name:   Joanne Larson Date of Exam: 01/07/2024 Medical Rec #:  [id number]       Height:       64.0 in Accession #:    [id number]      Weight:       80.0 lb Date of Birth:  09-14-1960       BSA:          1.326 m Patient Age:    62 years        BP:           156/111 mmHg Patient Gender: F               HR:           83 bpm. Exam Location:  Inpatient Procedure: 2D Echo (Both Spectral and  Color Flow Doppler were utilized during            procedure). Indications:    acute respiratory distress  History:        Patient has no prior history of Echocardiogram examinations.                 COPD; Risk Factors:Former Smoker and Hypertension.  Sonographer:    Dione Franks RDCS Referring Phys: [id number] OMAR M ALBUSTAMI IMPRESSIONS  1. Left ventricular ejection fraction, by estimation, is 60 to 65%. The left ventricle has normal function. The left ventricle has no regional wall motion abnormalities. Left ventricular diastolic parameters were normal.  2. Right ventricular systolic function is normal. The right ventricular size is normal. There is mildly elevated pulmonary artery systolic pressure. The estimated right ventricular systolic pressure is 34.4 mmHg.  3. The mitral valve is normal in structure. Trivial mitral valve regurgitation. No evidence of mitral stenosis.  4. The aortic valve is tricuspid. Aortic valve regurgitation is not visualized. No aortic stenosis is present.  5. The inferior vena cava is normal in size with <50% respiratory variability, suggesting right atrial pressure of 8 mmHg. FINDINGS  Left Ventricle: Left ventricular ejection fraction, by estimation, is 60 to 65%. The left ventricle has normal function. The left ventricle has no regional wall motion abnormalities. The left ventricular internal cavity size was normal in size. There is  no left ventricular hypertrophy. Left ventricular diastolic parameters were normal. Right Ventricle: The right ventricular size is normal. No increase in right ventricular wall thickness. Right ventricular systolic function is normal. There is mildly elevated pulmonary artery systolic pressure. The tricuspid regurgitant velocity is 2.57  m/s, and with an assumed right atrial pressure of 8 mmHg, the estimated right ventricular systolic pressure is 34.4 mmHg. Left Atrium: Left atrial size was normal in size. Right Atrium: Right atrial size was normal  in size. Pericardium: There is no evidence of pericardial effusion. Mitral Valve: The mitral valve is normal in structure. Trivial mitral valve regurgitation. No evidence of mitral valve stenosis. Tricuspid Valve: The tricuspid valve is normal in structure. Tricuspid valve regurgitation is trivial. No evidence of tricuspid stenosis. Aortic Valve: The aortic valve is tricuspid. Aortic valve regurgitation is not visualized. No aortic stenosis is present. Pulmonic Valve: The pulmonic valve was normal in structure. Pulmonic valve regurgitation is not visualized. No evidence of pulmonic stenosis. Aorta: The aortic root is normal in size and structure. Venous: The inferior  vena cava is normal in size with less than 50% respiratory variability, suggesting right atrial pressure of 8 mmHg. IAS/Shunts: No atrial level shunt detected by color flow Doppler.  LEFT VENTRICLE PLAX 2D LVIDd:         4.30 cm   Diastology LVIDs:         3.10 cm   LV e' medial:    10.40 cm/s LV PW:         0.70 cm   LV E/e' medial:  8.3 LV IVS:        0.70 cm   LV e' lateral:   14.10 cm/s LVOT diam:     1.80 cm   LV E/e' lateral: 6.1 LV SV:         44 LV SV Index:   33 LVOT Area:     2.54 cm  RIGHT VENTRICLE            IVC RV Basal diam:  2.50 cm    IVC diam: 1.70 cm RV S prime:     8.27 cm/s TAPSE (M-mode): 1.7 cm LEFT ATRIUM             Index        RIGHT ATRIUM          Index LA diam:        3.10 cm 2.34 cm/m   RA Area:     8.15 cm LA Vol (A2C):   38.2 ml 28.82 ml/m  RA Volume:   15.60 ml 11.77 ml/m LA Vol (A4C):   26.6 ml 20.07 ml/m LA Biplane Vol: 32.7 ml 24.67 ml/m  AORTIC VALVE LVOT Vmax:   81.20 cm/s LVOT Vmean:  55.500 cm/s LVOT VTI:    0.173 m  AORTA Ao Root diam: 2.50 cm MITRAL VALVE               TRICUSPID VALVE MV Area (PHT): 3.68 cm    TR Peak grad:   26.4 mmHg MV Decel Time: 206 msec    TR Vmax:        257.00 cm/s MV E velocity: 85.90 cm/s MV A velocity: 70.70 cm/s  SHUNTS MV E/A ratio:  1.21        Systemic VTI:  0.17 m                             Systemic Diam: 1.80 cm Mihai Croitoru MD Electronically signed by Luana Rumple MD Signature Date/Time: 01/07/2024/4:52:12 PM    Final     Disposition: Home  Discharge disposition: 01-Home or Self Care       Discharge Instructions     Call MD for:  difficulty breathing, headache or visual disturbances   Complete by: As directed    Call MD for:  extreme fatigue   Complete by: As directed    Call MD for:  persistant dizziness or light-headedness   Complete by: As directed    Call MD for:  temperature >100.4   Complete by: As directed    Discharge instructions   Complete by: As directed    Increase activity slowly Take 2 more days of cefdinir as prescribed 1 more day of prednisone  as prescribed Pulmonary clinic will reach out to you to schedule an appointment.   Increase activity slowly   Complete by: As directed       Allergies as of 01/08/2024   No Known Allergies      Medication  List     STOP taking these medications    azithromycin  250 MG tablet Commonly known as: ZITHROMAX        TAKE these medications    albuterol  (2.5 MG/3ML) 0.083% nebulizer solution Commonly known as: PROVENTIL  Take 3 mLs (2.5 mg total) by nebulization every 4 (four) hours as needed for wheezing or shortness of breath. What changed: Another medication with the same name was changed. Make sure you understand how and when to take each.   albuterol  108 (90 Base) MCG/ACT inhaler Commonly known as: ProAir  HFA 2 puffs every 4 hours as needed only  if your can't catch your breath What changed:  how much to take how to take this when to take this additional instructions   cefUROXime 500 MG tablet Commonly known as: CEFTIN Take 1 tablet (500 mg total) by mouth 2 (two) times daily with a meal.   predniSONE  20 MG tablet Commonly known as: DELTASONE  Take 2 tablets (40 mg total) by mouth daily with breakfast. Start taking on: Jan 09, 2024 What changed:  medication  strength how much to take how to take this when to take this additional instructions   Trelegy Ellipta  100-62.5-25 MCG/ACT Aepb Generic drug: Fluticasone-Umeclidin-Vilant One click each am What changed:  how much to take how to take this when to take this additional instructions               Durable Medical Equipment  (From admission, onward)           Start     Ordered   01/08/24 0951  For home use only DME 4 wheeled rolling walker with seat  Once       Question:  Patient needs a walker to treat with the following condition  Answer:  Acute on chronic respiratory failure with hypoxia (HCC)   01/08/24 0951             Discharged Condition: stable  36 minutes of time have been dedicated to discharge assessment, planning and discharge instructions.   Signed:  Roz Cornelia, AGACNP-BC [facility]  See Amion for personal pager PCCM on call pager [phone] until 7pm. Please call Elink 7p-7a. [phone]  01/08/2024 10:58 AM

## 2024-01-08 NOTE — Evaluation (Signed)
 Physical Therapy Brief Evaluation and Discharge Note Patient Details Name: Joanne Larson MRN: [id number] DOB: 04-02-1961 Today's Date: 01/08/2024   History of Present Illness  Pt is 63 year old presented to [facility] on  01/05/24 for copd exacerbation. Pt intubated and transferred to [facility]. Extubated 4/29. PMH - copd  Clinical Impression  Pt doing well with mobility and no further PT needed.  Ready for dc from PT standpoint. Recommend rollator for community ambulation at dc.        PT Assessment Patient does not need any further PT services  Assistance Needed at Discharge  PRN    Equipment Recommendations Rollator (4 wheels)  Recommendations for Other Services       Precautions/Restrictions Precautions Precautions: Other (comment) Precaution/Restrictions Comments: watch SpO2 Restrictions Weight Bearing Restrictions Per Provider Order: No        Mobility  Bed Mobility Rolling: Modified independent (Device/Increase time) Supine/Sidelying to sit: Modified independent (Device/Increased time)      Transfers Overall transfer level: Modified independent Equipment used: Rollator (4 wheels), None                    Ambulation/Gait Ambulation/Gait assistance: Supervision Gait Distance (Feet): 100 Feet Assistive device: Rollator (4 wheels) Gait Pattern/deviations: Step-through pattern, Decreased stride length Gait Speed: Below normal General Gait Details: Steady gait with rollator with dyspnea 2/4  Home Activity Instructions    Stairs            Modified Rankin (Stroke Patients Only)        Balance Overall balance assessment: Mild deficits observed, not formally tested                        Pertinent Vitals/Pain   Pain Assessment Pain Assessment: No/denies pain     Home Living Family/patient expects to be discharged to:: Private residence Living Arrangements: Spouse/significant other Available Help at Discharge: Family;Available 24  hours/day Home Environment: Stairs to enter;Rail - right;Rail - left  Stairs-Number of Steps: 2 Home Equipment: Other (comment)   Additional Comments: Home O2    Prior Function Level of Independence: Independent      UE/LE Assessment   UE ROM/Strength/Tone/Coordination: WFL    LE ROM/Strength/Tone/Coordination: WFL      Communication   Communication Communication: No apparent difficulties     Cognition Overall Cognitive Status: Appears within functional limits for tasks assessed/performed       General Comments General comments (skin integrity, edema, etc.): SpO2 81% on 8L with amb. SpO2 90% on 6L at rest.    Exercises     Assessment/Plan    PT Problem List         PT Visit Diagnosis Other abnormalities of gait and mobility (R26.89)    No Skilled PT Patient is supervision for all activity/mobility   Co-evaluation                AMPAC 6 Clicks Help needed turning from your back to your side while in a flat bed without using bedrails?: None Help needed moving from lying on your back to sitting on the side of a flat bed without using bedrails?: None Help needed moving to and from a bed to a chair (including a wheelchair)?: None Help needed standing up from a chair using your arms (e.g., wheelchair or bedside chair)?: None Help needed to walk in hospital room?: A Little Help needed climbing 3-5 steps with a railing? : A Little 6 Click Score: 22  End of Session Equipment Utilized During Treatment: Oxygen Activity Tolerance: Patient tolerated treatment well Patient left: in chair;with call bell/phone within reach;with chair alarm set Nurse Communication: Mobility status PT Visit Diagnosis: Other abnormalities of gait and mobility (R26.89)     Time: 6045-4098 PT Time Calculation (min) (ACUTE ONLY): 18 min  Charges:   PT Evaluation $PT Eval Low Complexity: 1 Low      [facility] PT Acute Rehabilitation Services Office  [phone]   Pura Browns [facility]  01/08/2024, 12:38 PM

## 2024-01-08 NOTE — Telephone Encounter (Signed)
 Patient is scheduled

## 2024-01-08 NOTE — Plan of Care (Signed)
  Problem: Education: Goal: Knowledge of General Education information will improve Description: Including pain rating scale, medication(s)/side effects and non-pharmacologic comfort measures Outcome: Progressing   Problem: Clinical Measurements: Goal: Ability to maintain clinical measurements within normal limits will improve Outcome: Progressing Goal: Respiratory complications will improve Outcome: Progressing   Problem: Pain Managment: Goal: General experience of comfort will improve and/or be controlled Outcome: Progressing   Problem: Safety: Goal: Ability to remain free from injury will improve Outcome: Progressing   Problem: Coping: Goal: Level of anxiety will decrease Outcome: Not Progressing

## 2024-01-08 NOTE — TOC Transition Note (Signed)
 Transition of Care [facility]) - Discharge Note   Patient Details  Name: Joanne Larson MRN: [id number] Date of Birth: 15-Feb-1961  Transition of Care [facility]) CM/SW Contact:  Tom-Johnson, Angelique Ken, RN Phone Number: 01/08/2024, 12:47 PM   Clinical Narrative:     Patient is scheduled for discharge today.  Readmission Risk Assessment done. Hospital f/u and discharge instructions on AVS. Prescriptions sent to [facility] pharmacy and patient will receive meds prior discharge. Rollator ordered from [employer] and [place] delivered to patient at bedside.  Significant other, Michael at bedside and will transport at discharge.  No further TOC needs noted.        Final next level of care: Home/Self Care Barriers to Discharge: Barriers Resolved   Patient Goals and CMS Choice Patient states their goals for this hospitalization and ongoing recovery are:: To return home CMS Medicare.gov Compare Post Acute Care list provided to:: Patient Choice offered to / list presented to : Patient      Discharge Placement                Patient to be transferred to facility by: Significant other Name of family member notified: Bambi Lever    Discharge Plan and Services Additional resources added to the After Visit Summary for                  DME Arranged: Walker rolling with seat DME Agency: [employer] Date DME Agency Contacted: 01/08/24 Time DME Agency Contacted: 1054 Representative spoke with at DME Agency: Raechel Bulla HH Arranged: NA HH Agency: NA        Social Drivers of Health (SDOH) Interventions SDOH Screenings   Tobacco Use: Medium Risk (11/13/2023)     Readmission Risk Interventions    01/08/2024   12:40 PM  Readmission Risk Prevention Plan  Post Dischage Appt Complete  Medication Screening Complete  Transportation Screening Complete

## 2024-01-09 ENCOUNTER — Telehealth: Payer: Self-pay | Admitting: Internal Medicine

## 2024-01-09 LAB — CULTURE, RESPIRATORY W GRAM STAIN

## 2024-01-09 NOTE — Telephone Encounter (Signed)
 Copied from CRM [id number]. Topic: General - Other >> Jan 09, 2024  9:50 AM Tyronne Galloway wrote: Reason for CRM: Pt stated she is having a [facility] form faxed over to the clinic regarding Medicaid approving her having a nurse to visit her at her home address. Pt stated that she didn't have a whole lot of information to provide, but if you have questions please call the patient at [phone].

## 2024-01-09 NOTE — Telephone Encounter (Signed)
 We have not seen this patient in almost 2 months, she has recent hospitalization. She is scheduled for OV on 01/16/24, she can discuss form request with provider at that time.

## 2024-01-10 LAB — CULTURE, BLOOD (ROUTINE X 2)
Culture: NO GROWTH
Culture: NO GROWTH
Special Requests: ADEQUATE
Special Requests: ADEQUATE

## 2024-01-11 ENCOUNTER — Other Ambulatory Visit: Payer: Self-pay | Admitting: Internal Medicine

## 2024-01-14 LAB — POCT I-STAT 7, (LYTES, BLD GAS, ICA,H+H)
Acid-Base Excess: 14 mmol/L — ABNORMAL HIGH (ref 0.0–2.0)
Bicarbonate: 40 mmol/L — ABNORMAL HIGH (ref 20.0–28.0)
Calcium, Ion: 1.16 mmol/L (ref 1.15–1.40)
HCT: 39 % (ref 36.0–46.0)
Hemoglobin: 13.3 g/dL (ref 12.0–15.0)
O2 Saturation: 80 %
Potassium: 4.4 mmol/L (ref 3.5–5.1)
Sodium: 137 mmol/L (ref 135–145)
TCO2: 42 mmol/L — ABNORMAL HIGH (ref 22–32)
pCO2 arterial: 53.7 mmHg — ABNORMAL HIGH (ref 32–48)
pH, Arterial: 7.479 — ABNORMAL HIGH (ref 7.35–7.45)
pO2, Arterial: 43 mmHg — ABNORMAL LOW (ref 83–108)

## 2024-01-15 NOTE — Progress Notes (Signed)
 Joanne Larson, female    DOB: 12-11-1960    MRN: [id number]   Brief patient profile:  63  yowf  quit smoking 06/2022/MM   with GOLD 4 copd criteria  06/21/21 and hypercarbic/hypoxemic RF at that point self - referred to pulmonary clinic in [facility]  02/26/2023  for copd   Onset of doe x 11/2020 >  seen by  Dr Aquilla Knapp  in [facility] sept 2022  "for disability eval"  > instead  admitted to [facility] and d/c'd on 6 lpm and stiolto which helped some until it ran out    History of Present Illness  61/18/2024  Pulmonary/ 1st office eval/ Welma Mccombs / [facility] on no maint rx/ using 02 prn  Chief Complaint  Patient presents with   Consult  Dyspnea:  still able to do cooking/ some cleaning  Gets let off at curb/ @ [employer] rides scooter  Cough: smoker's rattle > no mucus  Sleep: level bed  2 pillows  SABA use: once or twice daily at most, just hfa though has neb, doe not have solution  02: home conc = 6lpm Rec Make sure you check your oxygen saturation  AT  your highest level of activity (not after you stop)   to be sure it stays 85-90%   Plan A = Automatic = Always=    Stiolto 2 puffs 1st thing in am  Work on inhaler technique:     Plan B = Backup (to supplement plan A, not to replace it) Only use your albuterol  inhaler as a rescue medication)  Plan C = Crisis (instead of Plan B but only if Plan B stops working) - only use your albuterol  nebulizer if you first try Plan B Labs: 02/26/23   Alpha one AT  MM   level 184 /  EOS 0.1   Please schedule a follow up office visit in 4 weeks, sooner if needed  with all medications /inhalers/ solutions in hand    08/15/2023  f/u ov/[place] office/Shelia Magallon re: GOLD 4 copd /02 dep/ cor pulmonale  maint on alb prn  - out of all meds  Chief Complaint  Patient presents with   Shortness of Breath  Dyspnea:  still cooking / cleaning  Cough: smoker's rattle / mucus clear  Sleeping: level bed  2 pillow s resp cc  SABA use: completely out  02:  6lpm - does not titrate  Rec Plan A = Automatic = Always=    Trelegy 100 one each am  Work on inhaler technique:   Plan B = Backup (to supplement plan A, not to replace it) Only use your albuterol  inhaler (proaire )as a rescue medication Plan C = Crisis (instead of Plan B but only if Plan B stops working) - only use your albuterol  nebulizer if you first try Plan B  Make sure you check your oxygen saturation  AT  your highest level of activity (not after you stop)   to be sure it stays over 90%  Change in mucus > zpak x 5 days  Prednisone  10 mg take  4 each am x 2 days,   2 each am x 2 days,  1 each am x 2 days and stop       11/13/2023  f/u ov/[place] office/Leianne Callins re: GOLD 4 COPD/ 02 dep/ cor pulmonale maint on trelegy 100    Chief Complaint  Patient presents with   Follow-up    Pt wants discuss  oxygen   Dyspnea:  very sedentary / mb and back 300 ft round trip flat , stops half way both ways x 3 y  Cough: none  Sleeping: flat bed 2 pillows s  resp cc  SABA use: hfa > neb  avg one hfa/day  and neb one/rehab  02: 6lpm 25/7  Rec No change in medications  Make sure you check your oxygen saturation  AT  your highest level of activity (not after you stop)   to be sure it stays over 90%  My office will be contacting you by phone for referral to lung cancer screening CT  > not done as of 01/16/2024   Please schedule a follow up visit in 6  months but call sooner if needed    Admit date: 01/05/2024 Discharge date: 01/08/2024   Discharge Diagnoses:  Principal Problem:   COPD exacerbation (HCC) Active Problems:   Respiratory failure (HCC)   Acute on chronic respiratory failure (HCC)   Protein-calorie malnutrition, severe     History of Present Illness: 63 y/o female with 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  She presented to 4/27 [facility] with SOB x 1 day.  In the ED her PH 7.32, PCO2 115 on VBG and PCO2 110 on ABG.  Bicarb on chemistry 44 suggesting chronic CO2  retention. She was intubated at [facility] and transferred to [facility].  CXR showing severe hyperinflation and no infiltrates.   Hospital Course:  She was transferred to [facility] where she was admitted to the medical ICU on the mechanical ventilator. She was treated for COPD exacerbation with nebulized Brovana , Budesonide , Yupelri  and systemic glucocorticoids. She was also covered for community acquired pneumonia with azithromycin  and ceftriaxone .  She was able to be extubated 4/29 to BiPAP. Used BiPAP very briefly overnight, but was mostly on her home 6 liters via [place]. 4/30 she is ambulating the unit on her home oxygen and feels/seems to be at her baseline. She did desaturate some in to the 80's with ambulation, which is also in line with her chronic state. She is requesting to go home and is clinically ready.       Discharge Plan by active problems    COPD with acute exacerbation Chronic respiratory failure with hypoxia.  COVID, influenza, RVP, and RSV negative - Resume home Trellegy - Continue 2 additional days of prednisone  then stop - 2 additional days of cefdinir then stop.  - Requesting Pulmonary outpatient follow-up in our [place] office. - Already has oxygen supplies at home    Acute metabolic encephalopathy secondary to hypercarbia - Resolved   Intermittent fluid and electrolyte imbalance - Resolved   Hyperglycemia- improved with lower steroid dosing - Do not anticipate a home insulin  need  Deconditioning - PT evaluated and is recommending Rolator. This has been ordered. .     01/16/2024  f/u ov/[place] office/Sierah Lacewell re: GOLD 4 COPD/ 02 dep /cor pulmonale maint on trelegy 100  / last prednisone  dose 01/11/24 / note did not follow ABC plan prior to admit  Chief Complaint  Patient presents with   COPD  Dyspnea:  very sedentary has not tried walking to MB  Cough: min rattling  Sleeping: flat bed/ 2 pillows resp cc  SABA use: 2 x hfa  02: 6lpm 24/7   Lung cancer screening: re-ordered     No obvious day to day or daytime variability or assoc excess/ purulent sputum or mucus plugs or hemoptysis or cp or chest tightness, subjective wheeze or overt  sinus or hb symptoms.    Also denies any obvious fluctuation of symptoms with weather or environmental changes or other aggravating or alleviating factors except as outlined above   No unusual exposure hx or h/o childhood pna/ asthma or knowledge of premature birth.  Current Allergies, Complete Past Medical History, Past Surgical History, Family History, and Social History were reviewed in [employer] record.  ROS  The following are not active complaints unless bolded Hoarseness, sore throat, dysphagia, dental problems, itching, sneezing,  nasal congestion or discharge of excess mucus or purulent secretions, ear ache,   fever, chills, sweats, unintended wt loss or wt gain, classically pleuritic or exertional cp,  orthopnea pnd or arm/hand swelling  or leg swelling, presyncope, palpitations, abdominal pain, anorexia, nausea, vomiting, diarrhea  or change in bowel habits or change in bladder habits, change in stools or change in urine, dysuria, hematuria,  rash, arthralgias, visual complaints, headache, numbness, weakness or ataxia or problems with walking or coordination,  change in mood or  memory.        Current Meds  Medication Sig   albuterol  (PROVENTIL ) (2.5 MG/3ML) 0.083% nebulizer solution Take 3 mLs (2.5 mg total) by nebulization every 4 (four) hours as needed for wheezing or shortness of breath.   albuterol  (VENTOLIN  HFA) 108 (90 Base) MCG/ACT inhaler INHALE 2 PUFFS EVERY 4 HOURS AS NEEDED ONLY IF YOU CANNOT CATCH YOUR BREATH   cefUROXime  (CEFTIN ) 500 MG tablet Take 1 tablet (500 mg total) by mouth 2 (two) times daily with a meal.   Fluticasone-Umeclidin-Vilant (TRELEGY ELLIPTA ) 100-62.5-25 MCG/ACT AEPB One click each am (Patient taking differently: Inhale 1 puff into the lungs daily.)   predniSONE   (DELTASONE ) 20 MG tablet Take 2 tablets (40 mg total) by mouth daily with breakfast.            Objective:    Wts    01/16/2024          96  11/13/2023         101  08/15/23 101 lb (45.8 kg)  02/26/23 104 lb (47.2 kg)  11/03/16 106 lb (48.1 kg)    Vital signs reviewed  01/16/2024  - Note at rest 02 sats  90% on 6lpm cont   General appearance: amb talkative alert  wf nad      HEENT :  Oropharynx  clear   Nasal turbinates nl    NECK :  without JVD/Nodes/TM/ nl carotid upstrokes bilaterally   LUNGS: no acc muscle use,  Mod barrel  contour chest wall with bilateral  Distant bs s audible wheeze and  without cough on insp or exp maneuvers and mod  Hyperresonant  to  percussion bilaterally     CV:  RRR  no s3 or murmur or increase in P2, and no edema   ABD:  soft and nontender with pos mid insp Hoover's  in the supine position. No bruits or organomegaly appreciated, bowel sounds nl  MS:   Ext warm without deformities or   obvious joint restrictions , calf tenderness, cyanosis or clubbing  SKIN: warm and dry without lesions    NEURO:  alert, approp, nl sensorium with  no motor or cerebellar deficits apparent.          Assessment

## 2024-01-15 NOTE — Telephone Encounter (Signed)
 Joanne Larson w/ [facility] following up on form completion.  Message relayed that pt can discuss form with dr at her 5/08 appt.  C/b [phone]

## 2024-01-16 ENCOUNTER — Ambulatory Visit (INDEPENDENT_AMBULATORY_CARE_PROVIDER_SITE_OTHER): Admitting: Internal Medicine

## 2024-01-16 ENCOUNTER — Encounter: Payer: Self-pay | Admitting: Internal Medicine

## 2024-01-16 VITALS — BP 135/68 | HR 86 | Ht 64.0 in | Wt 96.0 lb

## 2024-01-16 DIAGNOSIS — J449 Chronic obstructive pulmonary disease, unspecified: Secondary | ICD-10-CM | POA: Diagnosis not present

## 2024-01-16 DIAGNOSIS — J9611 Chronic respiratory failure with hypoxia: Secondary | ICD-10-CM | POA: Diagnosis not present

## 2024-01-16 DIAGNOSIS — J9612 Chronic respiratory failure with hypercapnia: Secondary | ICD-10-CM

## 2024-01-16 MED ORDER — PREDNISONE 10 MG PO TABS: ORAL_TABLET | ORAL | 11 refills | Status: DC

## 2024-01-16 NOTE — Patient Instructions (Addendum)
 Goal on 02 is for saturations around 90% and no higher (risks worsening C02 levels)   Plan A = Automatic = Always=    Trelegy 100 one click  each am   Work on inhaler technique:  relax and gently blow all the way out then take a nice smooth full deep breath back in,    Hold breath in for at least  5 seconds if you can. Blow out trelegy  thru nose. Rinse and gargle with water when done.  If mouth or throat bother you at all,  try brushing teeth/gums/tongue with arm and hammer toothpaste/ make a slurry and gargle and spit out.     Plan B = Backup (to supplement plan A, not to replace it) Only use your albuterol  inhaler as a rescue medication to be used if you can't catch your breath by resting or doing a relaxed purse lip breathing pattern.  - The less you use it, the better it will work when you need it. - Ok to use the inhaler up to 2 puffs  every 4 hours if you must but call for appointment if use goes up over your usual need - Don't leave home without it !!  (think of it like the spare tire for your car)   Plan C = Crisis (instead of Plan B but only if Plan B stops working) - only use your albuterol  nebulizer if you first try Plan B and it fails to help > ok to use the nebulizer up to every 4 hours but if start needing it regularly call for immediate appointment   Plan D = Deltasone  = Refillable prednisone -  if ABC not working well: - Prednisone  10 mg take  4 each am x 2 days,   2 each am x 2 days,  1 each am x 2 days and stop   Plan E = ER - go to ER or call 911 if all else fails    Keep appt to see NP - call sooner if needed

## 2024-01-17 NOTE — Assessment & Plan Note (Signed)
 Quit smoking 06/2022/MM - Spirometry 06/21/21 FEV1 0.69 (28.8%)  Ratio 0.52  - ECHO 05/16/21 c/w cor pulmonale with RVSP  65   - CTa 05/16/21 c/w emphysema  - 02/26/2023  After extensive coaching inhaler device,  effectiveness =    75% with [facility]  - 02/26/23   alpha one AT  MM   level 184 /  EOS 0.1  - 08/15/2023  After extensive coaching inhaler device,  effectiveness =  80% with dpi > start trelelgy 100    - 01/16/2024  After extensive coaching inhaler device,  effectiveness =    75% from a baseline near 0  - 01/16/2024 added pred x 6 days and plan D   Group D (now reclassified as E) in terms of symptom/risk and laba/lama/ICS  therefore appropriate rx at this point >>>  trelegy used appropriately with prn saba  Re SABA :  I spent extra time with pt today reviewing appropriate use of albuterol  for prn use on exertion with the following points: 1) saba is for relief of sob that does not improve by walking a slower pace or resting but rather if the pt does not improve after trying this first. 2) If the pt is convinced, as many are, that saba helps recover from activity faster then it's easy to tell if this is the case by re-challenging : ie stop, take the inhaler, then p 5 minutes try the exact same activity (intensity of workload) that just caused the symptoms and see if they are substantially diminished or not after saba 3) if there is an activity that reproducibly causes the symptoms, try the saba 15 min before the activity on alternate days   If in fact the saba really does help, then fine to continue to use it prn but advised may need to look closer at the maintenance regimen being used to achieve better control of airways disease with exertion.

## 2024-01-17 NOTE — Assessment & Plan Note (Signed)
 Placed on 02 in [facility] 06/2021  - HC03  02/26/2023   = 36 - 02/26/2023   sats RA = 74% required 10lpm to maintain at 90% walking slowly x  50 - 75 ft which is her baseline activity tol   - 01/16/2024   Walked on 6lpm cont  x  1   lap(s) =  approx 150  ft  @ slow pace, stopped due to desats to 84%  improved on 8lpm so rec 8lpm with activty but always turn down with goals of sats around 90% no higher   She is a candidate for NIV if still has high HC03 levels though doubt that she would use is "when feeling good" which is where is as of now  - likely due to the high bicarb reducing her ventilatory demand and therefor her "air hunger"   - there's a fine line though between relieving air hunger and so high enough C02 to create AMS which was the recent problem.  Each maintenance medication was reviewed in detail including emphasizing most importantly the difference between maintenance and prns and under what circumstances the prns are to be triggered using an action plan format where appropriate.  Total time for H and P, chart review, counseling, reviewing hfa/dpi/02/pulse ox  device(s) , directly observing portions of ambulatory 02 saturation study/ and generating customized AVS unique to this office visit / same day charting  = 40  min for  post hosp pt with refractory respiratory problems.

## 2024-01-28 ENCOUNTER — Ambulatory Visit: Payer: Self-pay

## 2024-02-04 ENCOUNTER — Ambulatory Visit: Payer: Self-pay | Admitting: Internal Medicine

## 2024-02-04 NOTE — Telephone Encounter (Signed)
 Copied from CRM [id number]. Topic: Clinical - Red Word Triage >> Feb 04, 2024  3:30 PM Juliana Ocean wrote: Red Word that prompted transfer to Nurse Triage: pt needs mask to sleep, having breathing issues at night.  Was in hospital 1 mo ago. Please advise  TRIAGE SUMMARY NOTE: Pt reporting that she went to hospital for "CO2 building up in my body," saw Dr. Waymond Hailey on 5/9 for follow-up, has appt with Parrett NP previously scheduled for 6/10 to discuss treatment during sleep, but pt reporting worsening symptoms. Pt reporting that she "can tell" that CO2 is building up in her system again, stating that her body is jerking/shaking at times, she is waking up disoriented, and having more SOB intermittently especially "when stressed" due to her anxiety. Pt reporting she is at 91-92% today on 6.5L continuous oxygen, confirms 91-92% is the goal for her. Pt confirms no disorientation/confusion during other parts of day, no chest pain, etc. Pt states she was not told measures for when to return to hospital. Pt is primarily concerned with getting machine/"mask" to wear at night for "CO2 to pull out of my body" asap. Call disconnected, called pt back, speaking with pt. Advised pt go to hospital at this time for symptoms, placing appt with Parrett NP on waitlist, pt to receive call back from pulm about further recommendations. Pt verbalized understanding. Please advise.  E2C2 Pulmonary Triage - Initial Assessment Questions "Chief Complaint (e.g., cough, sob, wheezing, fever, chills, sweat or additional symptoms) *Go to specific symptom protocol after initial questions. Had to go to hospital 2 weeks ago, CO2 was building up in body Appt with Parrett NP Need to get machine/mask to wear at night for CO2 to pull out of my body, need it as soon as possible Wert is pulm CO2 is building up in my system, I can tell, body is jerking shaking at times In morning takes me a bit to orient myself past 3-4 days More SOB than normal, once in  a while not when sitting and relaxing but when stressed, really nervous person, need something for my nerves too Having trouble sleeping No chest pain, dizziness, or weakness when go to sleep at night is when CO2 is building up in my system Call disconnected, called back # [phone], no ringing/answer, voicemail box full Called pt at [phone], speaking with pt  OXYGEN: "Do you wear supplemental oxygen?" Yes If yes, "How many liters are you supposed to use?" 6.5L 24/7  "Do you monitor your oxygen levels?" Yes If yes, "What is your reading (oxygen level) today?" 91-92%  "What is your usual oxygen saturation reading?"  (Note: Pulmonary O2 sats should be 90% or greater) 91-92% my normal  Reason for Disposition  Patient sounds very sick or weak to the triager  Answer Assessment - Initial Assessment Questions 9. OTHER SYMPTOMS: "Do you have any other symptoms?" (e.g., fever, change in sputum)     Body jerking/shaking at times, more SOB intermittently, disoriented each morning, trouble sleeping  Protocols used: COPD Oxygen Monitoring and Hypoxia-A-AH

## 2024-02-13 ENCOUNTER — Telehealth: Payer: Self-pay

## 2024-02-13 NOTE — Telephone Encounter (Signed)
 Copied from CRM [id number]. Topic: General - Other >> Feb 13, 2024 11:03 AM [place] E wrote: Reason for CRM: Roman Cloud from [facility] called in asking if the office received a [employer] 30-51 form that she faxed over, would like this faxed back to [phone].

## 2024-02-14 NOTE — Telephone Encounter (Signed)
 This cannot be completed until after she has established care with our office. New pt appt scheduled on 03/20/24. Will address at that time

## 2024-02-17 ENCOUNTER — Telehealth: Payer: Self-pay | Admitting: Internal Medicine

## 2024-02-18 ENCOUNTER — Ambulatory Visit: Admitting: Adult Health

## 2024-02-18 VITALS — BP 150/73 | HR 100 | Ht 64.0 in | Wt 98.6 lb

## 2024-02-18 DIAGNOSIS — Z87891 Personal history of nicotine dependence: Secondary | ICD-10-CM | POA: Diagnosis not present

## 2024-02-18 DIAGNOSIS — R0689 Other abnormalities of breathing: Secondary | ICD-10-CM

## 2024-02-18 DIAGNOSIS — J441 Chronic obstructive pulmonary disease with (acute) exacerbation: Secondary | ICD-10-CM | POA: Diagnosis not present

## 2024-02-18 DIAGNOSIS — J449 Chronic obstructive pulmonary disease, unspecified: Secondary | ICD-10-CM

## 2024-02-18 DIAGNOSIS — J9611 Chronic respiratory failure with hypoxia: Secondary | ICD-10-CM

## 2024-02-18 NOTE — Patient Instructions (Addendum)
 Order for BIPAP-wear all night long and with naps  Check ABG on Oxygen.  Continue on Trelegy 1 puff daily, rinse after use.  Albuterol  inhaler or neb As needed   Activity as tolerated  High protein diet.  Work on not smoking.  Continue on Oxygen 6l/m to keep O2 sats >88-90%.  Follow up in 6 weeks and As needed   Please contact office for sooner follow up if symptoms do not improve or worsen or seek emergency care

## 2024-02-18 NOTE — Assessment & Plan Note (Signed)
 Recent COPD exacerbation now resolved and back to baseline.  Patient at baseline has significant symptom burden with increased shortness of breath with minimal activity.  She is prone to recurrent exacerbations recent hospitalization 6 weeks ago for acute on chronic hypercarbic and hypoxic respiratory failure. -Patient would be a good candidate for BIPAP due to underlying very severe COPD and chronic respiratory failure to help manage symptom burden and prevent recurrent hospitalization.  Check ABG-results pending. Order for BIPAP begin BIPAP 13/EPAP 6, PS 4. With Oxygen. Wear all night long.   Plan  Patient Instructions  Order for BIPAP-wear all night long and with naps  Check ABG on Oxygen.  Continue on Trelegy 1 puff daily, rinse after use.  Albuterol  inhaler or neb As needed   Activity as tolerated  High protein diet.  Work on not smoking.  Continue on Oxygen 6l/m to keep O2 sats >88-90%.  Follow up in 6 weeks and As needed   Please contact office for sooner follow up if symptoms do not improve or worsen or seek emergency care

## 2024-02-18 NOTE — Assessment & Plan Note (Signed)
 Continue on Oxygen 6l/m to keep sat >88-90%.

## 2024-02-18 NOTE — Progress Notes (Unsigned)
 @Patient  ID: Joanne Larson, female    DOB: [birth year], 63 y.o.   MRN: [id number]  Chief Complaint  Patient presents with   Follow-up    Referring provider: No ref. 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  TEST/EVENTS :  Spirometry 06/21/21 FEV1 0.69 (28.8%)  Ratio 0.52  - ECHO 05/16/21 c/w cor pulmonale with RVSP  65   02/26/23   alpha one AT  MM   level 184 /  EOS 0.1  -ABG (inpatient) pH 7.2/110/45  02/18/2024 Follow up OV : COPD, O2 RF  Patient presents for 1 month follow up office visit.  Patient was recently hospitalized in April for acute on chronic hypoxic and hypercarbic respiratory failure.  ABG showed pH is 7.2 pCO2 at 110 and HCO3 45.  Patient required intubation and mechanical vent support.  Was weaned to BiPAP. Says BIPAP really helped her. SShe is on home oxygen 6 L.  2D echo showed preserved EF at 60 to 65%, right ventricular size was normal.  And mildly elevated pulmonary artery systolic pressure.  She is on Trelegy inhaler daily.  Has albuterol  inhaler and nebulizer.  Patient was given a prednisone  taper last month.  Feels better -back to baseline. Says that she continues to have significant shortness of breath with minimal activities at baseline. .  Feels that her carbon dioxide levels are building back up, wants to start BIPAP to avoid this.  She denies any hemoptysis, edema, orthopnea.  No confusion. Today in office , O2 sats tried on lower oxygen -unable to tolerate O2 sats 81% on 2l/m , requires 6l/m to keep sats >88-90%. Wants to change DME closer to her so she does not have to drive so far for Oxygen .  Denies flare of cough or wheezing. Still smoking, trying to cut back . Education on not smoking around oxygen.      No Known Allergies  Immunization History  Administered Date(s) Administered   Tdap 08/07/2010    No past medical history on file.  Tobacco History: Social History   Tobacco Use  Smoking Status Former   Current packs/day: 0.00   Average packs/day: 1 pack/day for 20.0 years (20.0 ttl pk-yrs)   Types: Cigarettes   Start date: 06/10/2002   Quit date: 06/10/2022   Years since quitting: 1.6  Smokeless Tobacco Never   Counseling given: Not Answered   Outpatient Medications Prior to Visit  Medication Sig Dispense Refill   albuterol  (PROVENTIL ) (2.5 MG/3ML) 0.083% nebulizer solution Take 3 mLs (2.5 mg total) by nebulization every 4 (four) hours as needed for wheezing or shortness of breath. 75 mL 12   albuterol  (VENTOLIN  HFA) 108 (90 Base) MCG/ACT inhaler INHALE 2 PUFFS EVERY 4 HOURS AS NEEDED ONLY IF YOU CANNOT CATCH YOUR BREATH 8.5 g 1   cefUROXime  (CEFTIN ) 500 MG tablet Take 1 tablet (500 mg total) by mouth 2 (two) times daily with a meal. 4 tablet 0   Fluticasone-Umeclidin-Vilant (TRELEGY ELLIPTA ) 100-62.5-25 MCG/ACT AEPB One click each am (Patient taking differently: Inhale 1 puff into the lungs daily.) 1 each 11   predniSONE  (DELTASONE ) 10 MG tablet Take  4 each am x 2 days,   2 each am x 2 days,  1 each am x 2 days and stop 14 tablet 11  No facility-administered medications prior to visit.     Review of Systems:   Constitutional:   No  weight loss, night sweats,  Fevers, chills, +fatigue, or  lassitude.  HEENT:   No headaches,  Difficulty swallowing,  Tooth/dental problems, or  Sore throat,                No sneezing, itching, ear ache, nasal congestion, post nasal drip,   CV:  No chest pain,  Orthopnea, PND, swelling in lower extremities, anasarca, dizziness, palpitations, syncope.   GI  No heartburn, indigestion, abdominal pain, nausea, vomiting, diarrhea, change in bowel habits, loss of appetite, bloody stools.   Resp:   No chest wall deformity  Skin: no rash  or lesions.  GU: no dysuria, change in color of urine, no urgency or frequency.  No flank pain, no hematuria   MS:  No joint pain or swelling.  No decreased range of motion.  No back pain.    Physical Exam  BP (!) 150/73 (BP Location: Left Arm)   Pulse 100   Ht 5\' 4"  (1.626 m)   Wt 98 lb 9.6 oz (44.7 kg)   SpO2 96% Comment: 6.5 L cont  BMI 16.92 kg/m   GEN: A/Ox3; pleasant , NAD, thin and frail, On O2    HEENT:  [place]/AT,   NOSE-clear, THROAT-clear, no lesions, no postnasal drip or exudate noted.   NECK:  Supple w/ fair ROM; no JVD; normal carotid impulses w/o bruits; no thyromegaly or nodules palpated; no lymphadenopathy.    RESP  Clear  P & A; w/o, wheezes/ rales/ or rhonchi. no accessory muscle use, no dullness to percussion  CARD:  RRR, no m/r/g, no peripheral edema, pulses intact, no cyanosis or clubbing.  GI:   Soft & nt; nml bowel sounds; no organomegaly or masses detected.   Musco: Warm bil, no deformities or joint swelling noted.   Neuro: alert, no focal deficits noted.    Skin: Warm, no lesions or rashes    Lab Results:     BNP   ProBNP No results found for: "PROBNP"  Imaging: No results found.  Administration History     None           No data to display          No results found for: "NITRICOXIDE"      Assessment & Plan:   COPD exacerbation (HCC) Recent COPD exacerbation now resolved and back to baseline.  Patient at baseline has significant symptom burden with increased shortness of breath with minimal activity.  She is prone to recurrent exacerbations recent hospitalization 6 weeks ago for acute on chronic hypercarbic and hypoxic respiratory failure. -Patient would be a good candidate for BIPAP due to underlying very severe COPD and chronic respiratory failure to help manage symptom burden and prevent recurrent hospitalization.  Check ABG-results pending. Order for BIPAP begin BIPAP 13/EPAP 6, PS 4. With Oxygen. Wear all night  long.   Plan  Patient Instructions  Order for BIPAP-wear all night long and with naps  Check ABG on Oxygen.  Continue on Trelegy 1 puff daily, rinse after use.  Albuterol  inhaler or neb As needed   Activity as tolerated  High protein diet.  Work on not smoking.  Continue on Oxygen 6l/m to keep O2 sats >88-90%.  Follow up in 6 weeks and As needed   Please contact office for sooner follow up if symptoms do not improve or worsen or seek emergency care  Chronic respiratory failure with hypoxia and hypercapnia (HCC) Continue on Oxygen 6l/m to keep sat >88-90%.     Roena Clark, NP 02/18/2024

## 2024-02-20 ENCOUNTER — Telehealth: Payer: Self-pay

## 2024-02-20 NOTE — Telephone Encounter (Signed)
 At pt requested we use this # in the meantime [phone]

## 2024-02-20 NOTE — Telephone Encounter (Signed)
 Copied from CRM [id number]. Topic: General - Other >> Feb 20, 2024 10:48 AM Joanne Larson wrote: Reason for CRM:   Pt's phone number on file is not currently working, and she is waiting for call back from [place], regarding lab work.   Requests she be contacted at # [phone] at this time until her listed phone number is working again.

## 2024-02-21 ENCOUNTER — Telehealth: Payer: Self-pay | Admitting: Adult Health

## 2024-02-21 NOTE — Telephone Encounter (Signed)
 Per Mitch at [employer]- . I am attaching our RAD decision tree that shows what is required to qualify for a BiPAP through COPD. Of course we have a qualifying ABG but insurance will also require an Overnight Oximetry on her prescribed liter flow where she desaturates for 5 minutes or more. The ONO can be difficult to get qualifying readings while on their prescribed O2. However, NIV only requires a qualifying ABG which we have. If Joanne Larson wishes to take the NIV path to avoid the ONO I can have a prefilled order over in her hands as soon as I am advised to do so.   I will bring down the RAD tree for you. Please advise

## 2024-02-24 NOTE — Telephone Encounter (Signed)
 ABG has been ordered by Roena Clark NP on 6L of oxygen.  Awaiting it to scheduled.  Nothing further needed.

## 2024-02-25 NOTE — Telephone Encounter (Signed)
 Lets get the ABG set up please and then I will decide on next step. This was ordered over a week ago

## 2024-02-26 ENCOUNTER — Telehealth: Payer: Self-pay | Admitting: *Deleted

## 2024-02-26 NOTE — Telephone Encounter (Signed)
 Awaiting ABG to be scheduled.  Front staff, please advise.  Thank you.

## 2024-02-26 NOTE — Telephone Encounter (Signed)
 Per Mitch at [employer] We already have the ABG done 2 months ago and will usable for DME purposes for another 22 months. For BIPAP we only need the ONO on liter flow. Where she desaturates for 5 minutes or more If we are going NIV route we are all set and he can create the order now and no ONO will be needed

## 2024-02-26 NOTE — Telephone Encounter (Signed)
 Okay so they will take the Hospital ABG results  That is fine to change order to NIV- RT -  Do they need any additional information in my note ?

## 2024-02-27 ENCOUNTER — Other Ambulatory Visit: Payer: Self-pay | Admitting: *Deleted

## 2024-02-27 DIAGNOSIS — J9611 Chronic respiratory failure with hypoxia: Secondary | ICD-10-CM

## 2024-02-27 DIAGNOSIS — R0689 Other abnormalities of breathing: Secondary | ICD-10-CM

## 2024-02-27 NOTE — Telephone Encounter (Signed)
 Order placed.  Nothing further placed.

## 2024-03-03 NOTE — Telephone Encounter (Signed)
 Left voicemail for RT to call patient to schedule ABG preferably at [facility].

## 2024-03-05 ENCOUNTER — Inpatient Hospital Stay (HOSPITAL_COMMUNITY): Admission: RE | Admit: 2024-03-05 | Source: Ambulatory Visit

## 2024-03-05 NOTE — Telephone Encounter (Signed)
 Received message from [facility] stating ABG is not required from the DME for NIV. Patient had 2 done in the hospital 2 months ago. Patient is scheduled for an ABG TODAY- please advise if this is still needed.

## 2024-03-05 NOTE — Telephone Encounter (Signed)
 Spoke with Con regarding ABG for NIV, no new ABG needed.  Madelin Stank NP is aware.  Nothing further needed.

## 2024-03-05 NOTE — Telephone Encounter (Signed)
 Spoke with patient, aware ABG is not needed.

## 2024-03-05 NOTE — Telephone Encounter (Signed)
 Attempted to call patient, no answer- no voicemail. Spoke with partner Joanne Larson per DPR to have Joanne Larson give us  a call back. Need to cancel ABG for today at 2pm at [facility]. Secure chat sent to [facility] to inform of this.

## 2024-03-11 ENCOUNTER — Telehealth: Payer: Self-pay | Admitting: Adult Health

## 2024-03-11 NOTE — Telephone Encounter (Signed)
 Received order for NIV from [employer].  Form signed by Madelin Stank NP and handed to [facility].  Nothing further needed.

## 2024-03-11 NOTE — Telephone Encounter (Signed)
 Form completed, signed and handed to [facility].  Nothing further needed.

## 2024-03-12 ENCOUNTER — Telehealth: Payer: Self-pay | Admitting: Adult Health

## 2024-03-12 NOTE — Telephone Encounter (Signed)
 [employer] has the signed order, but insurance will also require a risk of harm/rule out statement in a progress note from Tammy for NIV. Mitch gave the below what that can look like.  Without the use of the ventilator the patient's condition will quickly deteriorate. Removal of the ventilator may cause serious harm to the patient, exacerbation of condition and hospital readmission.  Bilevel/RAD has been tried and failed to maintain or stabilize the patient. Bilevel does not meet current volume requirements. Patient requires frequent durations of ventilatory support. Intermittent usage is insufficient.  I will follow up on Tuesday when Tammy is back in the office as patient has been waiting.

## 2024-03-12 NOTE — Telephone Encounter (Signed)
 Error

## 2024-03-17 ENCOUNTER — Ambulatory Visit: Admitting: Adult Health

## 2024-03-17 ENCOUNTER — Encounter: Payer: Self-pay | Admitting: Adult Health

## 2024-03-17 ENCOUNTER — Ambulatory Visit (INDEPENDENT_AMBULATORY_CARE_PROVIDER_SITE_OTHER): Admitting: Adult Health

## 2024-03-17 VITALS — BP 130/65 | HR 87 | Ht 64.0 in | Wt 98.8 lb

## 2024-03-17 DIAGNOSIS — Z720 Tobacco use: Secondary | ICD-10-CM

## 2024-03-17 DIAGNOSIS — I272 Pulmonary hypertension, unspecified: Secondary | ICD-10-CM | POA: Diagnosis not present

## 2024-03-17 DIAGNOSIS — Z23 Encounter for immunization: Secondary | ICD-10-CM

## 2024-03-17 DIAGNOSIS — J9612 Chronic respiratory failure with hypercapnia: Secondary | ICD-10-CM

## 2024-03-17 DIAGNOSIS — J449 Chronic obstructive pulmonary disease, unspecified: Secondary | ICD-10-CM | POA: Diagnosis not present

## 2024-03-17 DIAGNOSIS — J9611 Chronic respiratory failure with hypoxia: Secondary | ICD-10-CM

## 2024-03-17 DIAGNOSIS — E43 Unspecified severe protein-calorie malnutrition: Secondary | ICD-10-CM

## 2024-03-17 NOTE — Telephone Encounter (Signed)
 I can not add that statement as she has not had BIPAP.  I will see her in office today, order ONO on O2 to see if we can get BIPAP as I ordered on 02/18/24 that has not been approved. Previous note from DME indicate a repeat ABG is not indicated as they can use the previous one from hospitalization from April 2025. Please verify that is correct so order for BIPAP can process

## 2024-03-17 NOTE — Progress Notes (Signed)
 @Patient  ID: Joanne Larson, female    DOB: 02-08-1961, 63 y.o.   MRN: [id number]  Chief Complaint  Patient presents with   Follow-up    Referring provider: No ref. 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   TEST/EVENTS :  Spirometry 06/21/21 FEV1 0.69 (28.8%)  Ratio 0.52  - ECHO 05/16/21 c/w cor pulmonale with RVSP  65   02/26/23   alpha one AT  MM   level 184 /  EOS 0.1  -ABG (inpatient) pH 7.2/110/45 2D echo EF 60 to 65%, right ventricle size normal.  Mildly elevated pulmonary artery systolic pressure.  03/17/2024 Follow up : COPD, O2 RF, Hypercarbia Patient presents for a 1 month follow-up.  Patient was hospitalized April 2025 for acute on chronic hypoxic and hypercarbic respiratory failure.  ABG showed a pH of 7.2 with a PCO2 at 110.,  Bicarb 45.  Patient required intubation and mechanical vent support.  She was weaned to BiPAP support. Discharged on 6l/m oxygen. Last visit patient was recommended to start on nocturnal BiPAP. Order was placed for urgent need. Unfortunately, Insurance and DME have not approved and she is here today to discuss options.  According to insurance/DME notes she is being required to have an Overnight oximetry test on O2 to verify nocturnal hypoxia . Order has been placed and sent to our care team for urgent processing.  Discussed the use of AI scribe software for clinical note transcription with the patient, who gave verbal consent to proceed.  She is currently on 6 liters of oxygen, as attempts to reduce the flow resulted in significant desaturation. Her home oxygen levels typically range from 88 to 90% on 6l/m   She uses her Trelegy inhaler and performs albuterol  nebulizer treatments every night, which she  finds helpful. She is actively working on quitting smoking, stating 'I'm quitting.'  Her weight is reportedly increasing, and she is following a high-protein diet. She manages her activity by alternating between rest and activity to maintain her energy levels.        No Known Allergies  Immunization History  Administered Date(s) Administered   Tdap 08/07/2010    No past medical history on file.  Tobacco History: Social History   Tobacco Use  Smoking Status Former   Current packs/day: 0.00   Average packs/day: 1 pack/day for 20.0 years (20.0 ttl pk-yrs)   Types: Cigarettes   Start date: 06/10/2002   Quit date: 06/10/2022   Years since quitting: 1.7  Smokeless Tobacco Never   Counseling given: Not Answered   Outpatient Medications Prior to Visit  Medication Sig Dispense Refill   albuterol  (PROVENTIL ) (2.5 MG/3ML) 0.083% nebulizer solution Take 3 mLs (2.5 mg total) by nebulization every 4 (four) hours as needed for wheezing or shortness of breath. 75 mL 12   albuterol  (VENTOLIN  HFA) 108 (90 Base) MCG/ACT inhaler INHALE 2 PUFFS EVERY 4 HOURS AS NEEDED ONLY IF YOU CANNOT CATCH YOUR BREATH 8.5 g 1   cefUROXime  (CEFTIN ) 500 MG tablet Take 1 tablet (500 mg total) by mouth 2 (two) times daily with a meal. 4 tablet 0   Fluticasone-Umeclidin-Vilant (TRELEGY ELLIPTA ) 100-62.5-25 MCG/ACT AEPB One click each am (Patient taking differently: Inhale 1 puff into the lungs daily.) 1 each 11   predniSONE  (DELTASONE ) 10 MG tablet  Take  4 each am x 2 days,   2 each am x 2 days,  1 each am x 2 days and stop 14 tablet 11   No facility-administered medications prior to visit.     Review of Systems:   Constitutional:   No  weight loss, night sweats,  Fevers, chills,+ fatigue, or  lassitude.  HEENT:   No headaches,  Difficulty swallowing,  Tooth/dental problems, or  Sore throat,                No sneezing, itching, ear ache, nasal congestion, post nasal drip,   CV:  No chest pain,  Orthopnea,  PND, swelling in lower extremities, anasarca, dizziness, palpitations, syncope.   GI  No heartburn, indigestion, abdominal pain, nausea, vomiting, diarrhea, change in bowel habits, loss of appetite, bloody stools.   Resp:  No chest wall deformity  Skin: no rash or lesions.  GU: no dysuria, change in color of urine, no urgency or frequency.  No flank pain, no hematuria   MS:  No joint pain or swelling.  No decreased range of motion.  No back pain.    Physical Exam  BP 130/65   Pulse 87   Ht 5' 4 (1.626 m)   Wt 98 lb 12.8 oz (44.8 kg)   SpO2 91% Comment: 6.5 L CONT  BMI 16.96 kg/m   GEN: A/Ox3; pleasant , NAD, frail and elderly    HEENT:  [place]/AT,  NOSE-clear, THROAT-clear, no lesions, no postnasal drip or exudate noted.   NECK:  Supple w/ fair ROM; no JVD; normal carotid impulses w/o bruits; no thyromegaly or nodules palpated; no lymphadenopathy.    RESP  Decreased BS in bases i. no accessory muscle use, no dullness to percussion  CARD:  RRR, no m/r/g, no peripheral edema, pulses intact, no cyanosis or clubbing.  GI:   Soft & nt; nml bowel sounds; no organomegaly or masses detected.   Musco: Warm bil, no deformities or joint swelling noted.   Neuro: alert, no focal deficits noted.    Skin: Warm, no lesions or rashes    Lab Results:      BNP   ProBNP No results found for: PROBNP  Imaging: No results found.  Administration History     None           No data to display          No results found for: NITRICOXIDE      Assessment & Plan:   No problem-specific Assessment & Plan notes found for this encounter. Assessment and Plan    Chronic obstructive pulmonary disease (COPD) with hypercarbic respiratory failure   COPD recently exacerbated, causing hypercarbic respiratory failure and necessitating ventilatory support. She is at high risk for future exacerbations and CO2 retention. Continue Trelegy inhaler and nebulizer treatments. Order  an overnight oximetry test to assess nocturnal oxygen levels. Urgently follow up with insurance for BiPAP approval. Consider additional medications to improve breathing and reduce hospitalizations. Smoking cessation. Discuss [place]  nebs on return ov.  Prevnar 20 vaccine today    Chronic hypoxic and Hypercarbic Respiratory Failure- She is on 6 liters of supplemental oxygen to maintain O2 levels >88-90%. Previous attempts to reduce oxygen led to significant desaturation. . Maintain current oxygen therapy at 6 liters and monitor oxygen levels at home. Recent hospitalization in April for Hypercarbic respiratory failure with PCo2 >100.  The patient continues to exhibit signs of hypercapnea associated with chronic respiratory failure secondary to severe COPD. Needs  frequent durations of BILEVEL support. An Interruption or failure to provide BIPAP  would quickly lead to exacerbation of the patient's condition, hospital readmission, and likely harm the patient.  Continued use is preferred.  The use of the BIPAP will treat the patient's PCO2 levels and can reduce the risk of exacerbations and future hospitalizations when used at night and during the day.   Tobacco use   She is actively working on smoking cessation and advised to avoid smoking in enclosed spaces and near oxygen. Continue smoking cessation efforts, avoid smoking in the home and car. Smoking cessation discussed in detail      Protein calorie malnutrition- recommend high protein diet.  Current BMI 16.     Madelin Stank, NP 03/17/2024

## 2024-03-17 NOTE — Patient Instructions (Addendum)
 Order for BIPAP-wear all night long and with naps  Check Overnight oximetry test on Oxygen.   Continue on Trelegy 1 puff daily, rinse after use.  Albuterol  inhaler or neb As needed   Activity as tolerated  High protein diet.  Work on not smoking.  Continue on Oxygen 6l/m to keep O2 sats >88-90%.  Prevnar vaccine today.  Follow up in 2 months and As needed

## 2024-03-17 NOTE — Progress Notes (Signed)
 @Patient  ID: Joanne Larson, female    DOB: 21-Nov-1960, 63 y.o.   MRN: [id number]  No chief complaint on file.   Referring provider: No ref. provider found  HPI:   TEST/EVENTS :   No Known Allergies  Immunization History  Administered Date(s) Administered   Tdap 08/07/2010    No past medical history on file.  Tobacco History: Social History   Tobacco Use  Smoking Status Former   Current packs/day: 0.00   Average packs/day: 1 pack/day for 20.0 years (20.0 ttl pk-yrs)   Types: Cigarettes   Start date: 06/10/2002   Quit date: 06/10/2022   Years since quitting: 1.7  Smokeless Tobacco Never   Counseling given: Not Answered   Outpatient Medications Prior to Visit  Medication Sig Dispense Refill   albuterol  (PROVENTIL ) (2.5 MG/3ML) 0.083% nebulizer solution Take 3 mLs (2.5 mg total) by nebulization every 4 (four) hours as needed for wheezing or shortness of breath. 75 mL 12   albuterol  (VENTOLIN  HFA) 108 (90 Base) MCG/ACT inhaler INHALE 2 PUFFS EVERY 4 HOURS AS NEEDED ONLY IF YOU CANNOT CATCH YOUR BREATH 8.5 g 1   cefUROXime  (CEFTIN ) 500 MG tablet Take 1 tablet (500 mg total) by mouth 2 (two) times daily with a meal. 4 tablet 0   Fluticasone-Umeclidin-Vilant (TRELEGY ELLIPTA ) 100-62.5-25 MCG/ACT AEPB One click each am (Patient taking differently: Inhale 1 puff into the lungs daily.) 1 each 11   predniSONE  (DELTASONE ) 10 MG tablet Take  4 each am x 2 days,   2 each am x 2 days,  1 each am x 2 days and stop 14 tablet 11   No facility-administered medications prior to visit.     Review of Systems:   Constitutional:   No  weight loss, night sweats,  Fevers, chills, fatigue, or  lassitude.  HEENT:   No headaches,  Difficulty swallowing,  Tooth/dental problems, or  Sore throat,                No sneezing, itching, ear ache, nasal congestion, post nasal drip,   CV:  No chest pain,  Orthopnea, PND, swelling in lower extremities, anasarca, dizziness, palpitations, syncope.    GI  No heartburn, indigestion, abdominal pain, nausea, vomiting, diarrhea, change in bowel habits, loss of appetite, bloody stools.   Resp: No shortness of breath with exertion or at rest.  No excess mucus, no productive cough,  No non-productive cough,  No coughing up of blood.  No change in color of mucus.  No wheezing.  No chest wall deformity  Skin: no rash or lesions.  GU: no dysuria, change in color of urine, no urgency or frequency.  No flank pain, no hematuria   MS:  No joint pain or swelling.  No decreased range of motion.  No back pain.    Physical Exam  There were no vitals taken for this visit.  GEN: A/Ox3; pleasant , NAD, well nourished    HEENT:  [place]/AT,  EACs-clear, TMs-wnl, NOSE-clear, THROAT-clear, no lesions, no postnasal drip or exudate noted.   NECK:  Supple w/ fair ROM; no JVD; normal carotid impulses w/o bruits; no thyromegaly or nodules palpated; no lymphadenopathy.    RESP  Clear  P & A; w/o, wheezes/ rales/ or rhonchi. no accessory muscle use, no dullness to percussion  CARD:  RRR, no m/r/g, no peripheral edema, pulses intact, no cyanosis or clubbing.  GI:   Soft & nt; nml bowel sounds; no organomegaly or masses detected.  Musco: Warm bil, no deformities or joint swelling noted.   Neuro: alert, no focal deficits noted.    Skin: Warm, no lesions or rashes    Lab Results:  CBC    Component Value Date/Time   WBC 7.9 01/08/2024 0426   RBC 4.05 01/08/2024 0426   HGB 12.3 01/08/2024 0426   HGB 13.3 02/26/2023 1555   HCT 39.4 01/08/2024 0426   HCT 41.0 02/26/2023 1555   PLT 151 01/08/2024 0426   PLT 267 02/26/2023 1555   MCV 97.3 01/08/2024 0426   MCV 95 02/26/2023 1555   MCH 30.4 01/08/2024 0426   MCHC 31.2 01/08/2024 0426   RDW 13.2 01/08/2024 0426   RDW 11.2 (L) 02/26/2023 1555   LYMPHSABS 2.2 01/08/2024 0426   LYMPHSABS 2.8 02/26/2023 1555   MONOABS 0.4 01/08/2024 0426   EOSABS 0.0 01/08/2024 0426   EOSABS 0.1 02/26/2023 1555    BASOSABS 0.0 01/08/2024 0426   BASOSABS 0.0 02/26/2023 1555    BMET    Component Value Date/Time   NA 141 01/08/2024 0426   NA 143 02/26/2023 1555   K 4.1 01/08/2024 0426   CL 98 01/08/2024 0426   CO2 36 (H) 01/08/2024 0426   GLUCOSE 88 01/08/2024 0426   BUN 13 01/08/2024 0426   BUN 5 (L) 02/26/2023 1555   CREATININE 0.62 01/08/2024 0426   CALCIUM 8.2 (L) 01/08/2024 0426   GFRNONAA >60 01/08/2024 0426    BNP    Component Value Date/Time   BNP 8.5 02/26/2023 1555    ProBNP No results found for: PROBNP  Imaging: No results found.  Administration History     None           No data to display          No results found for: NITRICOXIDE      Assessment & Plan:   No problem-specific Assessment & Plan notes found for this encounter.     Madelin Stank, NP 03/17/2024

## 2024-03-17 NOTE — Addendum Note (Signed)
 Addended by: RUFFUS LUKES T on: 03/17/2024 03:34 PM   Modules accepted: Orders

## 2024-03-17 NOTE — Telephone Encounter (Signed)
 Will discuss at ov on 03/17/24

## 2024-03-18 ENCOUNTER — Telehealth: Payer: Self-pay | Admitting: Adult Health

## 2024-03-18 NOTE — Telephone Encounter (Signed)
 Per Mitch at [employer]- For the route of NIV, the only thing that was missing was the risk of harm/rule out statement for approval. I saw that Tammy put a statement in yesterday (Needs frequent durations of BILEVEL support. An Interruption or failure to provide BIPAP would quickly lead to exacerbation of the patient's condition, hospital readmission, and likely harm the patient.  Continued use is preferred.  The use of the BIPAP will treat the patient's PCO2 levels and can reduce the risk of exacerbations and future hospitalizations when used at night and during the day).  The statement that was entered is in the correct format but the terms, ventilator and ventilatory, were removed and changed to BiPAP and Bilevel. This unfortunately will not get approved for NIV because it is not ruling out Bilevel devices but rather supporting them. If that statement is unable to be amended to support ventilation and rule out Bilevel our only option will be BiPAP. If BiPAP is the route taken, insurance will still require an ONO on her prescribed liter flow where she desaturates for 5 minutes or more. If she qualifies through that ONO, she will be given a standard BiPAP. To obtain a BiPAP ST/STA she will have to obtain further testing after initial setup on the standard BiPAP (Please see attached RAD Tree for guidance to that). Please let me know if there are any questions. Thanks!

## 2024-03-18 NOTE — Telephone Encounter (Signed)
 Spoke with Mitch who states patient will need to complete ONO (processed today with [employer]) to see if she qualifies for BiPAP, if she does not qualify for Bipap- can add this to qualify for NIV. Tammy please confirm this is the route we are going.

## 2024-03-18 NOTE — Telephone Encounter (Signed)
 I have sent an urgent message to [employer] asking what is needed to get the patient approved

## 2024-03-18 NOTE — Telephone Encounter (Signed)
 I received a message from [place] with [employer] and also spoke with Mitch from [employer] Last note on order yesterday :   Per Vent Team, this SO has been voided. Sales Rep Merilee Colorado told me in the chain We are going NIV route on this one. I have been in communication with the NP for the past couple of weeks and she is supposed to enter the rule out statement today.   - TM   We are awaiting the updated information to process. I have sent this message to Thomasina Colorado who is working on this.

## 2024-03-19 NOTE — Telephone Encounter (Signed)
 Yes

## 2024-03-20 ENCOUNTER — Ambulatory Visit: Payer: Self-pay

## 2024-03-25 NOTE — Telephone Encounter (Signed)
 Per Tammy, attempting to get patient Bipap after her most recent OV. Nothing further needed.

## 2024-03-25 NOTE — Telephone Encounter (Signed)
 See message from 7/9 from Tammy.  Closing encounter.

## 2024-04-28 ENCOUNTER — Telehealth: Payer: Self-pay | Admitting: *Deleted

## 2024-04-28 NOTE — Telephone Encounter (Signed)
 Received ONO from [employer] (performed by [facility]).  Placed in ONO folder for Tammy to review.

## 2024-05-01 ENCOUNTER — Telehealth: Payer: Self-pay | Admitting: *Deleted

## 2024-05-01 NOTE — Telephone Encounter (Signed)
 Please call patient to see how much O2 she is on At bedtime   Also did she get BIPAP   Needs OV with MD

## 2024-05-01 NOTE — Telephone Encounter (Signed)
 [facility] patient at [phone], no answer, no VM.  [facility] patient at [phone], no answer.  Left detailed message per DPR to call back regarding whether she has received the Bipap we ordered and whether she was wearing the Bipap during the ONO and if she was wearing oxygen.  If she was wearing oxygen, what liter flow.  Advised to return call.  Will await call.

## 2024-05-06 ENCOUNTER — Telehealth: Payer: Self-pay | Admitting: *Deleted

## 2024-05-06 NOTE — Telephone Encounter (Signed)
 Copied from CRM [id number]. Topic: General - Call Back - No Documentation >> May 01, 2024  8:58 AM Devaughn RAMAN wrote: Reason for CRM: Patient called returning [facility] phone call, contacted [facility] and spoke with Almeda. Almeda stated Powell currently with patients, advised patient Powell will f/u with her. Patient was thankful and verbalized understanding. Patient inquire of next appt, advised her it is September 8th at 2:30pm with Dr.Wert.  duplicate

## 2024-05-06 NOTE — Telephone Encounter (Signed)
 I spoke with the pt  She states that she has not received BIPAP machine or heard anything from DME ([employer]) Her ONO was done on o2 6.5 lpm   Routing to [place] and TP  Also- [facility]- can you please check on her BIPAP order? Thanks so much!

## 2024-05-07 NOTE — Telephone Encounter (Signed)
 Update from Mitch from [employer]: Hello, it took a while to reach her to get the ONO to her but we have received the ONO results now. She qualified for BiPAP with the results. I have sent this off to our processors. As soon as they finish processing, I will contact scheduling to let them know this is a stat setup. Thanks!  Thomasina Colorado Medical Device Representative  Email: [email or web address] BROCKS: [phone] F: [phone]

## 2024-05-12 ENCOUNTER — Telehealth: Payer: Self-pay

## 2024-05-12 DIAGNOSIS — J9611 Chronic respiratory failure with hypoxia: Secondary | ICD-10-CM

## 2024-05-12 DIAGNOSIS — R0609 Other forms of dyspnea: Secondary | ICD-10-CM

## 2024-05-12 DIAGNOSIS — J441 Chronic obstructive pulmonary disease with (acute) exacerbation: Secondary | ICD-10-CM

## 2024-05-12 NOTE — Telephone Encounter (Signed)
 Pt's boyfriend, Ozell called [facility]) stating pt is running out of O2. Pt has 0 back up tanks left and was supposed to get a delivery 2 weeks ago but never received it. Pt uses [employer] but is requesting a closer DME company. I informed Ozell that I could place an urgent order to [place] and d/c the O2 from [employer]. Ozell verbalized understanding. NFN

## 2024-05-13 NOTE — Telephone Encounter (Signed)
 Patient called in and stated she is running out of O2 and [facility] cannot help her right now---did we cancel her order with [employer] and if so, how is she supposed to get her oxygen----call back is [phone]

## 2024-05-14 NOTE — Telephone Encounter (Signed)
 Please read my note. Per my last note, I stated we cancelled her [employer] order due to ordering her o2 with [employer]. Pt's can not have 2 different orders from 2 different places. Pt wanted a closer DME due to living in [place]. We can have the PCC's see what the issue is with [employer].

## 2024-05-15 NOTE — Telephone Encounter (Addendum)
 There are 2 orders on 05/12/24, one order states [employer]- d/c o2 , and the other order states [facility] and is for patient receiving O2. [employer] has received the order to D/C O2 and [facility] has received the order to provide patient with O2.   I spoke to [place], patient is aware that [employer] will need to send her oxygen order to [facility]. She stated that she will continue with [facility] for oxygen until the switch is completed. [employer] will need to send her records to [place].  I spoke with the Patient. They already received their O2 since 05/12/24 and are not running low anymore. Patient will call [employer] at their own time to get their O2 order transferred over to [facility]. Nothing Further needed.

## 2024-05-17 NOTE — Progress Notes (Deleted)
 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 self - referred to pulmonary clinic in [facility]  02/26/2023  for copd   Onset of doe x 11/2020 >  seen by  Dr Izell  in [facility] sept 2022  for disability eval  > instead  admitted to [facility] and d/c'd on 6 lpm and stiolto which helped some until it ran out    History of Present Illness  02/26/2023  Pulmonary/ 1st office eval/ [place] [facility] / [place] [facility] on no maint rx/ using 02 prn  Chief Complaint  Patient presents with   Consult  Dyspnea:  still able to do cooking/ some cleaning  Gets let off at curb/ @ [employer] rides scooter  Cough: smoker's rattle > no mucus  Sleep: level bed  2 pillows  SABA use: once or twice daily at most, just hfa though has neb, doe not have solution  02: home conc = 6lpm Rec Make sure you check your oxygen saturation  AT  your highest level of activity (not after you stop)   to be sure it stays 85-90%   Plan A = Automatic = Always=    Stiolto 2 puffs 1st thing in am  Work on inhaler technique:     Plan B = Backup (to supplement plan A, not to replace it) Only use your albuterol  inhaler as a rescue medication)  Plan C = Crisis (instead of Plan B but only if Plan B stops working) - only use your albuterol  nebulizer if you first try Plan B Labs: 02/26/23   Alpha one AT  MM   level 184 /  EOS 0.1   Please schedule a follow up office visit in 4 weeks, sooner if needed  with all medications /inhalers/ solutions in hand    08/15/2023  f/u ov/[place] office/Joanne Larson re: GOLD 4 copd /02 dep/ cor pulmonale  maint on alb prn  - out of all meds  Chief Complaint  Patient presents with   Shortness of Breath  Dyspnea:  still cooking / cleaning  Cough: smoker's rattle / mucus clear  Sleeping: level bed  2 pillow s resp cc  SABA use: completely out  02:  6lpm - does not titrate  Rec Plan A = Automatic = Always=    Trelegy 100 one each am  Work on inhaler technique:   Plan B = Backup (to supplement plan A, not to replace it) Only use your albuterol  inhaler (proaire )as a rescue medication Plan C = Crisis (instead of Plan B but only if Plan B stops working) - only use your albuterol  nebulizer if you first try Plan B  Make sure you check your oxygen saturation  AT  your highest level of activity (not after you stop)   to be sure it stays over 90%  Change in mucus > zpak x 5 days  Prednisone  10 mg take  4 each am x 2 days,   2 each am x 2 days,  1 each am x 2 days and stop       11/13/2023  f/u ov/[place] office/Joanne Larson re: GOLD 4 COPD/ 02 dep/ cor pulmonale maint on trelegy 100    Chief Complaint  Patient presents with   Follow-up    Pt wants discuss  oxygen   Dyspnea:  very sedentary / mb and back 300 ft round trip flat , stops half way both ways x 3 y  Cough: none  Sleeping: flat bed 2 pillows s  resp cc  SABA use: hfa > neb  avg one hfa/day  and neb one/rehab  02: 6lpm 25/7  Rec No change in medications  Make sure you check your oxygen saturation  AT  your highest level of activity (not after you stop)   to be sure it stays over 90%  My office will be contacting you by phone for referral to lung cancer screening CT  > not done as of 01/16/2024   Please schedule a follow up visit in 6  months but call sooner if needed    Admit date: 01/05/2024 Discharge date: 01/08/2024   Discharge Diagnoses:  Principal Problem:   COPD exacerbation (HCC) Active Problems:   Respiratory failure (HCC)   Acute on chronic respiratory failure (HCC)   Protein-calorie malnutrition, severe     History of Present Illness: 63 y/o female with 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  She presented to 4/27 [facility] with SOB x 1 day.  In the ED her PH 7.32, PCO2 115 on VBG and PCO2 110 on ABG.  Bicarb on chemistry 44 suggesting chronic CO2  retention. She was intubated at [facility] and transferred to [facility].  CXR showing severe hyperinflation and no infiltrates.   Hospital Course:  She was transferred to [facility] where she was admitted to the medical ICU on the mechanical ventilator. She was treated for COPD exacerbation with nebulized Brovana , Budesonide , Yupelri  and systemic glucocorticoids. She was also covered for community acquired pneumonia with azithromycin  and ceftriaxone .  She was able to be extubated 4/29 to BiPAP. Used BiPAP very briefly overnight, but was mostly on her home 6 liters via [place]. 4/30 she is ambulating the unit on her home oxygen and feels/seems to be at her baseline. She did desaturate some in to the 80's with ambulation, which is also in line with her chronic state. She is requesting to go home and is clinically ready.       Discharge Plan by active problems    COPD with acute exacerbation Chronic respiratory failure with hypoxia.  COVID, influenza, RVP, and RSV negative - Resume home Trellegy - Continue 2 additional days of prednisone  then stop - 2 additional days of cefdinir then stop.  - Requesting Pulmonary outpatient follow-up in our [place] office. - Already has oxygen supplies at home    Acute metabolic encephalopathy secondary to hypercarbia - Resolved   Intermittent fluid and electrolyte imbalance - Resolved   Hyperglycemia- improved with lower steroid dosing - Do not anticipate a home insulin  need  Deconditioning - PT evaluated and is recommending Rolator. This has been ordered. .     01/16/2024  f/u ov/[place] office/Joanne Larson re: GOLD 4 COPD/ 02 dep /cor pulmonale maint on trelegy 100  / last prednisone  dose 01/11/24 / note did not follow ABC plan prior to admit  Chief Complaint  Patient presents with   COPD  Dyspnea:  very sedentary has not tried walking to MB  Cough: min rattling  Sleeping: flat bed/ 2 pillows resp cc  SABA use: 2 x hfa  02: 6lpm 24/7  Lung cancer screening: re-ordered >  not done as of 05/18/2024  Rec Goal on 02 is for saturations around 90% and no higher (risks worsening C02 levels)  Plan A = Automatic = Always=  Trelegy 100 one click  each am  Work on inhaler technique:  Plan B = Backup (to supplement plan A, not to replace it) Only use your albuterol  inhaler as a rescue medication  Plan C = Crisis (instead of Plan B but only if Plan B stops working) - only use your albuterol  nebulizer if you first try Plan B  Plan D = Deltasone  = Refillable prednisone -  if ABC not working well: - Prednisone  10 mg take  4 each am x 2 days,   2 each am x 2 days,  1 each am x 2 days and stop     05/18/2024  f/u ov/[place] office/Joanne Larson re: *** maint on *** needs LCS ***  No chief complaint on file.   Dyspnea:  *** Cough: *** Sleeping: ***   resp cc  SABA use: *** 02: ***  Lung cancer screening: ***   No obvious day to day or daytime variability or assoc excess/ purulent sputum or mucus plugs or hemoptysis or cp or chest tightness, subjective wheeze or overt sinus or hb symptoms.    Also denies any obvious fluctuation of symptoms with weather or environmental changes or other aggravating or alleviating factors except as outlined above   No unusual exposure hx or h/o childhood pna/ asthma or knowledge of premature birth.  Current Allergies, Complete Past Medical History, Past Surgical History, Family History, and Social History were reviewed in [employer] record.  ROS  The following are not active complaints unless bolded Hoarseness, sore throat, dysphagia, dental problems, itching, sneezing,  nasal congestion or discharge of excess mucus or purulent secretions, ear ache,   fever, chills, sweats, unintended wt loss or wt gain, classically pleuritic or exertional cp,  orthopnea pnd or arm/hand swelling  or leg swelling, presyncope, palpitations, abdominal pain, anorexia, nausea, vomiting, diarrhea  or change in bowel habits or change in bladder  habits, change in stools or change in urine, dysuria, hematuria,  rash, arthralgias, visual complaints, headache, numbness, weakness or ataxia or problems with walking or coordination,  change in mood or  memory.        No outpatient medications have been marked as taking for the 05/18/24 encounter (Appointment) with Joanne Larson B, MD.            Objective:    Wts   05/18/2024          ***  01/16/2024          96  11/13/2023         101  08/15/23 101 lb (45.8 kg)  02/26/23 104 lb (47.2 kg)  11/03/16 106 lb (48.1 kg)    Vital signs reviewed  05/18/2024  - Note at rest 02 sats  ***% on ***   General appearance:    ***     Mod barr ***  .          Assessment

## 2024-05-18 ENCOUNTER — Ambulatory Visit: Admitting: Internal Medicine

## 2024-05-18 DIAGNOSIS — J449 Chronic obstructive pulmonary disease, unspecified: Secondary | ICD-10-CM

## 2024-05-18 DIAGNOSIS — J9611 Chronic respiratory failure with hypoxia: Secondary | ICD-10-CM

## 2024-05-21 NOTE — Telephone Encounter (Signed)
 I called [phone], there was no answer and no vm.   I called [phone] and the number was not in service.  Patient has no mychart account.

## 2024-05-29 ENCOUNTER — Encounter: Payer: Self-pay | Admitting: *Deleted

## 2024-05-29 NOTE — Telephone Encounter (Signed)
 [facility] patient x2 at [phone] and it rang and then went to a fast busy signal.  No mychart set up.  Unable to reach letter sent.

## 2024-06-22 ENCOUNTER — Ambulatory Visit: Admitting: Internal Medicine

## 2024-06-22 DIAGNOSIS — J9611 Chronic respiratory failure with hypoxia: Secondary | ICD-10-CM

## 2024-06-22 DIAGNOSIS — J449 Chronic obstructive pulmonary disease, unspecified: Secondary | ICD-10-CM

## 2024-06-22 NOTE — Progress Notes (Deleted)
 Joanne Larson, female    DOB: [birth year]    MRN: [id number]   Brief patient profile:  63  yowf  quit smoking 06/2022/MM   with GOLD 4 copd criteria  06/21/21 and hypercarbic/hypoxemic RF at that point self - referred to pulmonary clinic in [facility]  02/26/2023  for copd   Onset of doe x 11/2020 >  seen by  Dr Izell  in [facility] sept 2022  for disability eval  > instead  admitted to [facility] and d/c'd on 6 lpm and stiolto which helped some until it ran out    History of Present Illness  02/26/2023  Pulmonary/ 1st office eval/ Sophee Mckimmy / [place] [facility] on no maint rx/ using 02 prn  Chief Complaint  Patient presents with   Consult  Dyspnea:  still able to do cooking/ some cleaning  Gets let off at curb/ @ [employer] rides scooter  Cough: smoker's rattle > no mucus  Sleep: level bed  2 pillows  SABA use: once or twice daily at most, just hfa though has neb, doe not have solution  02: home conc = 6lpm Rec Make sure you check your oxygen saturation  AT  your highest level of activity (not after you stop)   to be sure it stays 85-90%   Plan A = Automatic = Always=    Stiolto 2 puffs 1st thing in am  Work on inhaler technique:     Plan B = Backup (to supplement plan A, not to replace it) Only use your albuterol  inhaler as a rescue medication)  Plan C = Crisis (instead of Plan B but only if Plan B stops working) - only use your albuterol  nebulizer if you first try Plan B Labs: 02/26/23   Alpha one AT  MM   level 184 /  EOS 0.1   Please schedule a follow up office visit in 4 weeks, sooner if needed  with all medications /inhalers/ solutions in hand    08/15/2023  f/u ov/[place] office/Caitlyne Ingham re: GOLD 4 copd /02 dep/ cor pulmonale  maint on alb prn  - out of all meds  Chief Complaint  Patient presents with   Shortness of Breath  Dyspnea:  still cooking / cleaning  Cough: smoker's rattle / mucus clear  Sleeping: level bed  2 pillow s resp cc  SABA use: completely out  02:  6lpm - does not titrate  Rec Plan A = Automatic = Always=    Trelegy 100 one each am  Work on inhaler technique:   Plan B = Backup (to supplement plan A, not to replace it) Only use your albuterol  inhaler (proaire )as a rescue medication Plan C = Crisis (instead of Plan B but only if Plan B stops working) - only use your albuterol  nebulizer if you first try Plan B  Make sure you check your oxygen saturation  AT  your highest level of activity (not after you stop)   to be sure it stays over 90%  Change in mucus > zpak x 5 days  Prednisone  10 mg take  4 each am x 2 days,   2 each am x 2 days,  1 each am x 2 days and stop       11/13/2023  f/u ov/[place] office/Tomma Ehinger re: GOLD 4 COPD/ 02 dep/ cor pulmonale maint on trelegy 100    Chief Complaint  Patient presents with   Follow-up    Pt wants discuss  oxygen   Dyspnea:  very sedentary / mb and back 300 ft round trip flat , stops half way both ways x 3 y  Cough: none  Sleeping: flat bed 2 pillows s  resp cc  SABA use: hfa > neb  avg one hfa/day  and neb one/rehab  02: 6lpm 25/7  Rec No change in medications  Make sure you check your oxygen saturation  AT  your highest level of activity (not after you stop)   to be sure it stays over 90%  My office will be contacting you by phone for referral to lung cancer screening CT  > not done as of 01/16/2024   Please schedule a follow up visit in 6  months but call sooner if needed    Admit date: 01/05/2024 Discharge date: 01/08/2024   Discharge Diagnoses:  Principal Problem:   COPD exacerbation (HCC) Active Problems:   Respiratory failure (HCC)   Acute on chronic respiratory failure (HCC)   Protein-calorie malnutrition, severe     History of Present Illness: 63 y/o female with 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  She [facility] to [facility]27 [facility] with SOB x 1 day.  In the ED her PH 7.32, PCO2 115 on VBG and PCO2 110 on ABG.  Bicarb on chemistry 44 suggesting chronic CO2  retention. She was intubated at [facility] and transferred to [facility].  CXR showing severe hyperinflation and no infiltrates.   Hospital Course:  She was transferred to [facility] where she was admitted to the medical ICU on the mechanical ventilator. She was treated for COPD exacerbation with nebulized Brovana , Budesonide , Yupelri  and systemic glucocorticoids. She was also covered for community acquired pneumonia with azithromycin  and ceftriaxone .  She was able to be extubated 4/29 to BiPAP. Used BiPAP very briefly overnight, but was mostly on her home 6 liters via [place]. 4/30 she is ambulating the unit on her home oxygen and feels/seems to be at her baseline. She did desaturate some in to the 80's with ambulation, which is also in line with her chronic state. She is requesting to go home and is clinically ready.       Discharge Plan by active problems    COPD with acute exacerbation Chronic respiratory failure with hypoxia.  COVID, influenza, RVP, and RSV negative - Resume home Trellegy - Continue 2 additional days of prednisone  then stop - 2 additional days of cefdinir then stop.  - Requesting Pulmonary outpatient follow-up in our [place] office. - Already has oxygen supplies at home    Acute metabolic encephalopathy secondary to hypercarbia - Resolved   Intermittent fluid and electrolyte imbalance - Resolved   Hyperglycemia- improved with lower steroid dosing - Do not anticipate a home insulin  need  Deconditioning - PT evaluated and is recommending Rolator. This has been ordered. .     01/16/2024  f/u ov/[place] office/Zaydin Billey re: GOLD 4 COPD/ 02 dep /cor pulmonale maint on trelegy 100  / last prednisone  dose 01/11/24 / note did not follow ABC plan prior to admit  Chief Complaint  Patient presents with   COPD  Dyspnea:  very sedentary has not tried walking to MB  Cough: min rattling  Sleeping: flat bed/ 2 pillows resp cc  SABA use: 2 x hfa  02: 6lpm 24/7  Lung cancer screening: re-ordered >  not done as of 06/22/2024  Goal on 02 is for saturations around 90% and no higher (risks worsening C02 levels)  Plan A = Automatic = Always=  Trelegy 100 one click  each am  Work on inhaler technique:    Plan B = Backup (to supplement plan A, not to replace it) Only use your albuterol  inhaler as a rescue medication  Plan C = Crisis (instead of Plan B but only if Plan B stops working) - only use your albuterol  nebulizer if you first try Plan B  Plan D = Deltasone  = Refillable prednisone -  if ABC not working well: - Prednisone  10 mg take  4 each am x 2 days,   2 each am x 2 days,  1 each am x 2 days and stop       06/22/2024  f/u ov/[place] office/Latonja Bobeck re: *** maint on ***  LDSCT *** No chief complaint on file.   Dyspnea:  *** Cough: *** Sleeping: ***   resp cc  SABA use: *** 02: ***  Lung cancer screening: ***   No obvious day to day or daytime variability or assoc excess/ purulent sputum or mucus plugs or hemoptysis or cp or chest tightness, subjective wheeze or overt sinus or hb symptoms.    Also denies any obvious fluctuation of symptoms with weather or environmental changes or other aggravating or alleviating factors except as outlined above   No unusual exposure hx or h/o childhood pna/ asthma or knowledge of premature birth.  Current Allergies, Complete Past Medical History, Past Surgical History, Family History, and Social History were reviewed in [employer] record.  ROS  The following are not active complaints unless bolded Hoarseness, sore throat, dysphagia, dental problems, itching, sneezing,  nasal congestion or discharge of excess mucus or purulent secretions, ear ache,   fever, chills, sweats, unintended wt loss or wt gain, classically pleuritic or exertional cp,  orthopnea pnd or arm/hand swelling  or leg swelling, presyncope, palpitations, abdominal pain, anorexia, nausea, vomiting, diarrhea  or change in bowel habits or change in bladder  habits, change in stools or change in urine, dysuria, hematuria,  rash, arthralgias, visual complaints, headache, numbness, weakness or ataxia or problems with walking or coordination,  change in mood or  memory.        No outpatient medications have been marked as taking for the 06/22/24 encounter (Appointment) with Nollie Terlizzi B, MD.              Objective:    Wts  06/22/2024      ***   01/16/2024          96  11/13/2023         101  08/15/23 101 lb (45.8 kg)  02/26/23 104 lb (47.2 kg)  11/03/16 106 lb (48.1 kg)    Vital signs reviewed  06/22/2024  - Note at rest 02 sats  ***% on ***   General appearance:    ***  ,  Mod barr ***      Assessment

## 2024-07-16 ENCOUNTER — Ambulatory Visit: Admitting: Adult Health

## 2024-07-30 ENCOUNTER — Ambulatory Visit: Admitting: Internal Medicine

## 2024-07-30 DIAGNOSIS — J449 Chronic obstructive pulmonary disease, unspecified: Secondary | ICD-10-CM

## 2024-07-30 NOTE — Progress Notes (Deleted)
 Slater DELENA Pouch, female    DOB: Feb 03, 1961    MRN: [id number]   Brief patient profile:  52  yowf  quit smoking 06/2022/MM   with GOLD 4 copd criteria  06/21/21 and hypercarbic/hypoxemic RF at that point self - referred to pulmonary clinic in [facility]  02/26/2023  for copd   Onset of doe x 11/2020 >  seen by  Dr Izell  in [facility] sept 2022  for disability eval  > instead  admitted to [facility] and d/c'd on 6 lpm and stiolto which helped some until it ran out    History of Present Illness  02/26/2023  Pulmonary/ 1st office eval/ Amber Williard / [place] [facility] on no maint rx/ using 02 prn  Chief Complaint  Patient presents with   Consult  Dyspnea:  still able to do cooking/ some cleaning  Gets let off at curb/ @ [employer] rides scooter  Cough: smoker's rattle > no mucus  Sleep: level bed  2 pillows  SABA use: once or twice daily at most, just hfa though has neb, doe not have solution  02: home conc = 6lpm Rec Make sure you check your oxygen saturation  AT  your highest level of activity (not after you stop)   to be sure it stays 85-90%   Plan A = Automatic = Always=    Stiolto 2 puffs 1st thing in am  Work on inhaler technique:     Plan B = Backup (to supplement plan A, not to replace it) Only use your albuterol  inhaler as a rescue medication)  Plan C = Crisis (instead of Plan B but only if Plan B stops working) - only use your albuterol  nebulizer if you first try Plan B Labs: 02/26/23   Alpha one AT  MM   level 184 /  EOS 0.1   Please schedule a follow up office visit in 4 weeks, sooner if needed  with all medications /inhalers/ solutions in hand    08/15/2023  f/u ov/[place] office/Clance Baquero re: GOLD 4 copd /02 dep/ cor pulmonale  maint on alb prn  - out of all meds  Chief Complaint  Patient presents with   Shortness of Breath  Dyspnea:  still cooking / cleaning  Cough: smoker's rattle / mucus clear  Sleeping: level bed  2 pillow s resp cc  SABA use: completely out  02:  6lpm - does not titrate  Rec Plan A = Automatic = Always=    Trelegy 100 one each am  Work on inhaler technique:   Plan B = Backup (to supplement plan A, not to replace it) Only use your albuterol  inhaler (proaire )as a rescue medication Plan C = Crisis (instead of Plan B but only if Plan B stops working) - only use your albuterol  nebulizer if you first try Plan B  Make sure you check your oxygen saturation  AT  your highest level of activity (not after you stop)   to be sure it stays over 90%  Change in mucus > zpak x 5 days  Prednisone  10 mg take  4 each am x 2 days,   2 each am x 2 days,  1 each am x 2 days and stop       11/13/2023  f/u ov/[place] office/Ollis Daudelin re: GOLD 4 COPD/ 02 dep/ cor pulmonale maint on trelegy 100    Chief Complaint  Patient presents with   Follow-up    Pt wants discuss  oxygen   Dyspnea:  very sedentary / mb and back 300 ft round trip flat , stops half way both ways x 3 y  Cough: none  Sleeping: flat bed 2 pillows s  resp cc  SABA use: hfa > neb  avg one hfa/day  and neb one/rehab  02: 6lpm 25/7  Rec No change in medications  Make sure you check your oxygen saturation  AT  your highest level of activity (not after you stop)   to be sure it stays over 90%  My office will be contacting you by phone for referral to lung cancer screening CT  > not done as of 01/16/2024     Admit date: 01/05/2024 Discharge date: 01/08/2024   Discharge Diagnoses:  Principal Problem:   COPD exacerbation (HCC) Active Problems:   Respiratory failure (HCC)   Acute on chronic respiratory failure (HCC)   Protein-calorie malnutrition, severe     History of Present Illness: 63 y/o female with 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  She presented to [facility] with SOB x 1 day.  In the ED her PH 7.32, PCO2 115 on VBG and PCO2 110 on ABG.  Bicarb on chemistry 44 suggesting chronic CO2 retention. She was intubated at [facility] and transferred to [place].  CXR showing  severe hyperinflation and no infiltrates.   Hospital Course:  She was transferred to [facility] where she was admitted to the medical ICU on the mechanical ventilator. She was treated for COPD exacerbation with nebulized Brovana , Budesonide , Yupelri  and systemic glucocorticoids. She was also covered for community acquired pneumonia with azithromycin  and ceftriaxone .  She was able to be extubated 4/29 to BiPAP. Used BiPAP very briefly overnight, but was mostly on her home 6 liters via [place]. 4/30 she is ambulating the unit on her home oxygen and feels/seems to be at her baseline. She did desaturate some in to the 80's with ambulation, which is also in line with her chronic state. She is requesting to go home and is clinically ready.       Discharge Plan by active problems    COPD with acute exacerbation Chronic respiratory failure with hypoxia.  COVID, influenza, RVP, and RSV negative - Resume home Trellegy - Continue 2 additional days of prednisone  then stop - 2 additional days of cefdinir then stop.  - Requesting Pulmonary outpatient follow-up in our [place] office. - Already has oxygen supplies at home    Acute metabolic encephalopathy secondary to hypercarbia - Resolved   Intermittent fluid and electrolyte imbalance - Resolved   Hyperglycemia- improved with lower steroid dosing - Do not anticipate a home insulin  need  Deconditioning - PT evaluated and is recommending Rolator. This has been ordered. .     01/16/2024  f/u ov/[place] office/Tajai Ihde re: GOLD 4 COPD/ 02 dep /cor pulmonale maint on trelegy 100  / last prednisone  dose 01/11/24 / note did not follow ABC plan prior to admit  Chief Complaint  Patient presents with   COPD  Dyspnea:  very sedentary has not tried walking to MB  Cough: min rattling  Sleeping: flat bed/ 2 pillows resp cc  SABA use: 2 x hfa  02: 6lpm 24/7  Lung cancer screening: re-ordered  Rec Goal on 02 is for saturations around 90% and no higher (risks  worsening C02 levels)  Plan A = Automatic = Always=    Trelegy 100 one click  each am  Work on inhaler technique.  Plan B = Backup (to supplement plan  A, not to replace it) Only use your albuterol  inhaler as a rescue medication Plan C = Crisis (instead of Plan B but only if Plan B stops working) - only use your albuterol  nebulizer if you first try Plan B Plan D = Deltasone  = Refillable prednisone -  if ABC not working well: - Prednisone  10 mg take  4 each am x 2 days,   2 each am x 2 days,  1 each am x 2 days and stop    NP recs 03/17/24 Order for BIPAP-wear all night long and with naps  Check Overnight oximetry test on Oxygen.   Continue on Trelegy 1 puff daily, rinse after use.  Albuterol  inhaler or neb As needed   Activity as tolerated  High protein diet.  Work on not smoking.  Continue on Oxygen 6l/m to keep O2 sats >88-90%.  Prevnar vaccine today.  Follow up in 2 months and As needed     07/30/2024  f/u ov/[place] office/Lasheba Stevens re:  GOLD 4 COPD/ 02 dep /cor pulmonale  maint on ***   LDSCT ? Stillapprop? (Never done as rec No chief complaint on file.   Dyspnea:  *** Cough: *** Sleeping: ***   resp cc  SABA use: *** 02: ***  Lung cancer screening: ***   No obvious day to day or daytime variability or assoc excess/ purulent sputum or mucus plugs or hemoptysis or cp or chest tightness, subjective wheeze or overt sinus or hb symptoms.    Also denies any obvious fluctuation of symptoms with weather or environmental changes or other aggravating or alleviating factors except as outlined above   No unusual exposure hx or h/o childhood pna/ asthma or knowledge of premature birth.  Current Allergies, Complete Past Medical History, Past Surgical History, Family History, and Social History were reviewed in [employer] record.  ROS  The following are not active complaints unless bolded Hoarseness, sore throat, dysphagia, dental problems, itching, sneezing,   nasal congestion or discharge of excess mucus or purulent secretions, ear ache,   fever, chills, sweats, unintended wt loss or wt gain, classically pleuritic or exertional cp,  orthopnea pnd or arm/hand swelling  or leg swelling, presyncope, palpitations, abdominal pain, anorexia, nausea, vomiting, diarrhea  or change in bowel habits or change in bladder habits, change in stools or change in urine, dysuria, hematuria,  rash, arthralgias, visual complaints, headache, numbness, weakness or ataxia or problems with walking or coordination,  change in mood or  memory.        No outpatient medications have been marked as taking for the 07/30/24 encounter (Appointment) with Peyton Spengler B, MD.               Objective:    Wts    07/30/2024       ***  01/16/2024          96  11/13/2023         101  08/15/23 101 lb (45.8 kg)  02/26/23 104 lb (47.2 kg)  11/03/16 106 lb (48.1 kg)     Vital signs reviewed  07/30/2024  - Note at rest 02 sats  ***% on ***   General appearance:    ***   Mod bar ***         Assessment

## 2024-07-31 ENCOUNTER — Telehealth: Payer: Self-pay

## 2024-07-31 NOTE — Telephone Encounter (Signed)
 Copied from CRM #[id number]. Topic: Clinical - Medication Refill >> Jul 31, 2024 11:18 AM Leila BROCKS wrote: Patient [phone] is asking NP, Parrett will refil her COPD medications at the office visit 08/24/24, that why patient does not have to make another appointment with Dr. Darlean? Patient states does not need refills now. Please advise and call back.  ATCx1 unable to LVM as mailbox was full.

## 2024-08-05 NOTE — Telephone Encounter (Signed)
 ATCx1, unable to LVM

## 2024-08-12 NOTE — Telephone Encounter (Signed)
 ATC,unable to leave message,will send letter,will close out in encounter per protocol

## 2024-08-17 ENCOUNTER — Other Ambulatory Visit: Payer: Self-pay | Admitting: Internal Medicine

## 2024-08-24 ENCOUNTER — Ambulatory Visit (HOSPITAL_BASED_OUTPATIENT_CLINIC_OR_DEPARTMENT_OTHER): Payer: Self-pay | Admitting: Adult Health

## 2024-08-24 ENCOUNTER — Ambulatory Visit: Admitting: Adult Health

## 2024-08-24 NOTE — Telephone Encounter (Addendum)
° °  FYI Only or Action Required?: FYI only for provider: appointment scheduled on 12/17.  Patient is followed in Pulmonology for Chronic Respiratory failure, last seen on 03/17/2024 by Parrett, Madelin RAMAN, NP.  Called Nurse Triage reporting Appointment.    Triage Disposition: See PCP Within 2 Weeks  Patient/caregiver understands and will follow disposition?: Yes    Reason for Disposition  Requesting regular office appointment  Answer Assessment - Initial Assessment Questions 1. REASON FOR CALL: What is the main reason for your call? or How can I best help you?   Patient called in stating the office cancelled her appointment for today 12/15. Patient calling back to rescheduled the appointment. She was successfully re scheduled for 12/17, per her preference. She stated she has 8 more days of her Trilogy left as well. No further questions/ concerns at this time.  Protocols used: Information Only Call - No Triage-A-AH

## 2024-08-26 ENCOUNTER — Ambulatory Visit: Admitting: Primary Care

## 2024-08-31 ENCOUNTER — Other Ambulatory Visit: Payer: Self-pay | Admitting: Internal Medicine

## 2024-08-31 NOTE — Telephone Encounter (Unsigned)
 Copied from CRM #[id number]. Topic: Clinical - Medication Refill >> Aug 31, 2024  1:22 PM Daniela A wrote: Medication: Fluticasone-Umeclidin-Vilant (TRELEGY ELLIPTA ) 100-62.5-25 MCG/ACT AEPB albuterol  (PROVENTIL ) (2.5 MG/3ML) 0.083% nebulizer solution  Has the patient contacted their pharmacy? No (Agent: If no, request that the patient contact the pharmacy for the refill. If patient does not wish to contact the pharmacy document the reason why and proceed with request.) (Agent: If yes, when and what did the pharmacy advise?)  This is the patient's preferred pharmacy:  [facility] [phone] - [place], [place] - [address] AT [facility] [address] [place] [address] Phone: [phone] Fax: [phone]  Is this the correct pharmacy for this prescription? Yes If no, delete pharmacy and type the correct one.   Has the prescription been filled recently? Yes  Is the patient out of the medication? Yes  Has the patient been seen for an appointment in the last year OR does the patient have an upcoming appointment? Yes  Can we respond through MyChart? No  Agent: Please be advised that Rx refills may take up to 3 business days. We ask that you follow-up with your pharmacy.

## 2024-09-01 MED ORDER — ALBUTEROL SULFATE (2.5 MG/3ML) 0.083% IN NEBU: 2.5000 mg | INHALATION_SOLUTION | RESPIRATORY_TRACT | 12 refills | Status: DC | PRN

## 2024-09-01 MED ORDER — TRELEGY ELLIPTA 100-62.5-25 MCG/ACT IN AEPB: INHALATION_SPRAY | RESPIRATORY_TRACT | 3 refills | Status: DC

## 2024-09-11 ENCOUNTER — Other Ambulatory Visit (HOSPITAL_COMMUNITY): Payer: Self-pay

## 2024-09-11 ENCOUNTER — Telehealth: Payer: Self-pay

## 2024-09-11 ENCOUNTER — Ambulatory Visit: Admitting: Internal Medicine

## 2024-09-11 ENCOUNTER — Encounter: Payer: Self-pay | Admitting: Internal Medicine

## 2024-09-11 VITALS — BP 147/70 | HR 106 | Ht 64.0 in | Wt 97.0 lb

## 2024-09-11 DIAGNOSIS — J9612 Chronic respiratory failure with hypercapnia: Secondary | ICD-10-CM | POA: Diagnosis not present

## 2024-09-11 DIAGNOSIS — Z87891 Personal history of nicotine dependence: Secondary | ICD-10-CM

## 2024-09-11 DIAGNOSIS — J9611 Chronic respiratory failure with hypoxia: Secondary | ICD-10-CM

## 2024-09-11 DIAGNOSIS — I2781 Cor pulmonale (chronic): Secondary | ICD-10-CM

## 2024-09-11 DIAGNOSIS — J4489 Other specified chronic obstructive pulmonary disease: Secondary | ICD-10-CM

## 2024-09-11 DIAGNOSIS — J449 Chronic obstructive pulmonary disease, unspecified: Secondary | ICD-10-CM

## 2024-09-11 MED ORDER — TRELEGY ELLIPTA 100-62.5-25 MCG/ACT IN AEPB: 1.0000 | INHALATION_SPRAY | Freq: Every day | RESPIRATORY_TRACT | Status: AC

## 2024-09-11 MED ORDER — ALBUTEROL SULFATE HFA 108 (90 BASE) MCG/ACT IN AERS: INHALATION_SPRAY | RESPIRATORY_TRACT | 11 refills | Status: AC

## 2024-09-11 MED ORDER — ALBUTEROL SULFATE (2.5 MG/3ML) 0.083% IN NEBU: 2.5000 mg | INHALATION_SOLUTION | RESPIRATORY_TRACT | 12 refills | Status: AC | PRN

## 2024-09-11 MED ORDER — PREDNISONE 10 MG PO TABS: ORAL_TABLET | ORAL | 1 refills | Status: AC

## 2024-09-11 NOTE — Telephone Encounter (Signed)
*  Pulm  Pharmacy Patient Advocate Encounter   Received notification from Fax that prior authorization for Albuterol  Sulfate (2.5 MG/3ML)0.083% nebulizer solution   is required/requested.   Insurance verification completed.   The patient is insured through [facility].   Per test claim: Medication is not eligible for pharmacy benefits and must be billed through medical insurance. As our team only handles pharmacy related prior auths, medical PA's must be submitted by the clinic. Thank you   Key: [id number]

## 2024-09-11 NOTE — Assessment & Plan Note (Addendum)
 Placed on 02 in [facility] 06/2021  - HC03  02/26/2023   = 36 - 02/26/2023   sats RA = 74% required 10lpm to maintain at 90% walking slowly x  50 - 75 ft which is her baseline activity tol  - 01/16/2024   Walked on 6lpm cont  x  1   lap(s) =  approx 150  ft  @ slow pace, stopped due to desats to 84%  improved on 8lpm so rec 8lpm with activty but always turn down with goals of sats around 90% no higher   Again advised:  Make sure you check your oxygen saturation  AT  your highest level of activity (not after you stop)   to be sure it stays over 90% and adjust  02 flow upward to maintain this level if needed but remember to turn it back to previous settings when you stop (to conserve your supply).

## 2024-09-11 NOTE — Progress Notes (Signed)
 "   Joanne Larson, female    DOB: June 21, 1961    MRN: [id number]   Brief patient profile:  43  yowf  quit smoking 06/2022/MM   with GOLD 4 copd criteria  06/21/21 and hypercarbic/hypoxemic RF at that point self - referred to pulmonary clinic in [facility]  02/26/2023  for copd   Onset of doe x 11/2020 >  seen by  Dr Izell  in [facility] sept 2022  for disability eval  > instead  admitted to [facility] and d/c'd on 6 lpm and stiolto which helped some until it ran out    History of Present Illness  02/26/2023  Pulmonary/ 1st office eval/ Kodie Pick / [facility] on no maint rx/ using 02 prn  Chief Complaint  Patient presents with   Consult  Dyspnea:  still able to do cooking/ some cleaning  Gets let off at curb/ @ [employer] scooter  Cough: smoker's rattle > no mucus  Sleep: level bed  2 pillows  SABA use: once or twice daily at most, just hfa though has neb, doe not have solution  02: home conc = 6lpm Rec Make sure you check your oxygen saturation  AT  your highest level of activity (not after you stop)   to be sure it stays 85-90%   Plan A = Automatic = Always=    Stiolto 2 puffs 1st thing in am  Work on inhaler technique:     Plan B = Backup (to supplement plan A, not to replace it) Only use your albuterol  inhaler as a rescue medication)  Plan C = Crisis (instead of Plan B but only if Plan B stops working) - only use your albuterol  nebulizer if you first try Plan B Labs: 02/26/23   Alpha one AT  MM   level 184 /  EOS 0.1      Admit date: 01/05/2024 Discharge date: 01/08/2024   Discharge Diagnoses:  Principal Problem:   COPD exacerbation (HCC)   Respiratory failure (HCC)   Acute on chronic respiratory failure (HCC)   Protein-calorie malnutrition, severe     01/16/2024  f/u ov/[place] office/Joanne Larson re: GOLD 4 COPD/ 02 dep /cor pulmonale maint on trelegy 100  / last prednisone  dose 01/11/24 / note did not follow ABC plan prior to admit  Chief Complaint  Patient  presents with   COPD  Dyspnea:  very sedentary has not tried walking to MB  Cough: min rattling  Sleeping: flat bed/ 2 pillows resp cc  SABA use: 2 x hfa  02: 6lpm 24/7  Lung cancer screening: re-ordered  Rec Goal on 02 is for saturations around 90% and no higher (risks worsening C02 levels)  Plan A = Automatic = Always=    Trelegy 100 one click  each am  Work on inhaler technique:   Plan B = Backup (to supplement plan A, not to replace it) Only use your albuterol  inhaler as a rescue medication Plan C = Crisis (instead of Plan B but only if Plan B stops working) - only use your albuterol  nebulizer if you first try Plan B  Plan D = Deltasone  = Refillable prednisone -  if ABC not working well: - Prednisone  10 mg take  4 each am x 2 days,   2 each am x 2 days,  1 each am x 2 days and stop    09/09/24 admit [facility]  dx aecopd/ ?CAP LUL pCXR prior to d/c = no acute findings  09/11/2024  f/u ov/[place] office/Joanne Larson re: GOLD 4 COPD/ 02 dep /cor pulmonale bipap dep   maint on trelegy 100 out x 2 days  still on prednisone  x 2 daily, has consistently helped breathing alot Chief Complaint  Patient presents with   COPD    Hsp f/u due to copd dr stated she is (end stage copd) Had pna / to much co2    Dyspnea:  improved but still mostly housebound due to doe across the room  Cough: none  Sleeping: on bipap 4 h / night  s resp cc  SABA use: neb bid  02: 6lpm not titrating     No obvious day to day or daytime variability or assoc excess/ purulent sputum or mucus plugs or hemoptysis or cp or chest tightness, subjective wheeze or overt sinus or hb symptoms.    Also denies any obvious fluctuation of symptoms with weather or environmental changes or other aggravating or alleviating factors except as outlined above   No unusual exposure hx or h/o childhood pna/ asthma or knowledge of premature birth.  Current Allergies, Complete Past Medical History, Past Surgical History, Family History, and  Social History were reviewed in [employer] record.  ROS  The following are not active complaints unless bolded Hoarseness, sore throat, dysphagia, dental problems, itching, sneezing,  nasal congestion or discharge of excess mucus or purulent secretions, ear ache,   fever, chills, sweats, unintended wt loss or wt gain, classically pleuritic or exertional cp,  orthopnea pnd or arm/hand swelling  or leg swelling, presyncope, palpitations, abdominal pain, anorexia, nausea, vomiting, diarrhea  or change in bowel habits or change in bladder habits, change in stools or change in urine, dysuria, hematuria,  rash, arthralgias, visual complaints, headache, numbness, weakness or ataxia or problems with walking or coordination,  change in mood or  memory.         Outpatient Medications Prior to Visit  Medication Sig Dispense Refill   albuterol  (PROVENTIL ) (2.5 MG/3ML) 0.083% nebulizer solution Take 3 mLs (2.5 mg total) by nebulization every 4 (four) hours as needed for wheezing or shortness of breath. 75 mL 12   albuterol  (VENTOLIN  HFA) 108 (90 Base) MCG/ACT inhaler INHALE 2 PUFFS EVERY 4 HOURS AS NEEDED ONLY IF YOU CANNOT CATCH YOUR BREATH 8.5 g 1   Fluticasone-Umeclidin-Vilant (TRELEGY ELLIPTA ) 100-62.5-25 MCG/ACT AEPB INHALE 1 PUFF EVERY MORNING 60 each 3   predniSONE  (DELTASONE ) 10 MG tablet Take  4 each am x 2 days,   2 each am x 2 days,  1 each am x 2 days and stop 14 tablet 11   cefUROXime  (CEFTIN ) 500 MG tablet Take 1 tablet (500 mg total) by mouth 2 (two) times daily with a meal. (Patient not taking: Reported on 09/11/2024) 4 tablet 0   No facility-administered medications prior to visit.            Objective:    Wts    09/11/2024          97  01/16/2024          96  11/13/2023         101  08/15/23 101 lb (45.8 kg)  02/26/23 104 lb (47.2 kg)  11/03/16 106 lb (48.1 kg)    Vital signs reviewed  09/11/2024  - Note at rest 02 sats  87% on 2lpm   General appearance:     amb wf chronically ill slt tremulous / poor dentition    HEENT :  Oropharynx  clear   Nasal turbinates nl    NECK :  without JVD/Nodes/TM/ nl carotid upstrokes bilaterally   LUNGS: no acc muscle use,  Mod barrel  contour chest wall with bilateral  Distant bs s audible wheeze and  without cough on insp or exp maneuvers and mod  Hyperresonant  to  percussion bilaterally     CV:  RRR  no s3 or murmur or increase in P2, and no edema   ABD:  soft and nontender with pos mid insp Hoover's  in the supine position. No bruits or organomegaly appreciated, bowel sounds nl  MS:   Ext warm without deformities or   obvious joint restrictions , calf tenderness, cyanosis or clubbing  SKIN: warm and dry without lesions    NEURO:  alert, approp, nl sensorium with  no motor or cerebellar deficits apparent.              Assessment   Assessment & Plan COPD mixed type (HCC) Quit smoking 06/2022/MM - Spirometry 06/21/21 FEV1 0.69 (28.8%)  Ratio 0.52  - ECHO 05/16/21 c/w cor pulmonale with RVSP  65   - CTa 05/16/21 c/w emphysema  - 02/26/2023  After extensive coaching inhaler device,  effectiveness =    75% with [facility]  - 02/26/23   alpha one AT  MM   level 184 /  EOS 0.1  - 08/15/2023  After extensive coaching inhaler device,  effectiveness =  80% with dpi > start trelelgy 100    - 01/16/2024 added pred x 6 days and plan D  >>>  09/11/2024 changed plan D to ceiling 20 mg and floor 5 mg  >>>   09/11/2024  After extensive coaching inhaler device,  effectiveness =    75% with DPI (poor insp flows)    Group E in terms of symptoms/risk so  laba/lama/ICS  therefore appropriate rx at this point  >>>  trelegy 100   and approp SABA prn.  Re SABA :  I spent extra time with pt today reviewing appropriate use of albuterol  for prn use on exertion with the following points: 1) saba is for relief of sob that does not improve by walking a slower pace or resting but rather if the pt does not improve after trying this first. 2)  If the pt is convinced, as many are, that saba helps recover from activity faster then it's easy to tell if this is the case by re-challenging : ie stop, take the inhaler, then p 5 minutes try the exact same activity (intensity of workload) that just caused the symptoms and see if they are substantially diminished or not after saba 3) if there is an activity that reproducibly causes the symptoms, try the saba 15 min before the activity on alternate days   If in fact the saba really does help, then fine to continue to use it prn but advised may need to look closer at the maintenance regimen being used to achieve better control of airways disease with exertion.    Chronic respiratory failure with hypoxia and hypercapnia (HCC) Placed on 02 in [facility] 06/2021  - HC03  02/26/2023   = 36 - 02/26/2023   sats RA = 74% required 10lpm to maintain at 90% walking slowly x  50 - 75 ft which is her baseline activity tol  - 01/16/2024   Walked on 6lpm cont  x  1   lap(s) =  approx 150  ft  @ slow pace, stopped due to  desats to 84%  improved on 8lpm so rec 8lpm with activty but always turn down with goals of sats around 90% no higher   Again advised:  Make sure you check your oxygen saturation  AT  your highest level of activity (not after you stop)   to be sure it stays over 90% and adjust  02 flow upward to maintain this level if needed but remember to turn it back to previous settings when you stop (to conserve your supply).   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  Total time for H and P, chart review, counseling, reviewing hfa/DPI/elipta/02/ pulse ox  device(s) and generating customized AVS unique to this office  visit / same day charting = 35 min post hosp f/u          AVS  Patient Instructions  Make sure you check your oxygen saturation  AT  your highest level of activity (not after you stop)   to be sure it stays over 90% and adjust  02 flow upward to maintain this level if needed but remember to turn it back to previous settings when you stop (to conserve your supply).   Goal on 02 is for saturations around 90% and no higher (risks worsening C02 levels)   Plan A = Automatic = Always=    Trelegy 100 one click  each am  and at least 1/2 tablet of prednisone    Work on inhaler technique:  relax and gently blow all the way out then take a nice smooth full deep breath back in,    Hold breath in for at least  5 seconds if you can. Blow out trelegy  thru nose. Rinse and gargle with water when done.  If mouth or throat bother you at all,  try brushing teeth/gums/tongue with arm and hammer toothpaste/ make a slurry and gargle and spit out.     Plan B = Backup (to supplement plan A, not to replace it) Only use your albuterol  inhaler as a rescue medication to be used if you can't catch your breath by resting or doing a relaxed purse lip breathing pattern.  - The less you use it, the better it will work when you need it. - Ok to use the inhaler up to 2 puffs  every 4 hours if you must but call for appointment if use goes up over your usual need - Don't leave home without it !!  (think of it like the spare tire for your car)   Plan C = Crisis (instead of Plan B but only if Plan B stops working) - only use your albuterol  nebulizer if you first try Plan B and it fails to help > ok to use the nebulizer up to every 4 hours but if start needing it regularly call for immediate appointment  Also  Ok to try albuterol  15 min before an activity (on alternating days)  that you know would usually make you short of breath and see if it makes any difference and if makes none then don't take albuterol  after activity unless  you can't catch your breath as this means it's the resting that helps, not the  albuterol .      Plan D = Deltasone  = Refillable prednisone -  if ABC not working well: - Prednisone  10 mg take  2  daily until better then 1 daily for a week then one half daily is new floor   Plan E = ER - go to ER or call 911 if all else fails    Please schedule a follow up office visit in 6 weeks, call sooner if needed with all medications /inhalers/ solutions in hand so we can verify exactly what you are taking. This includes all medications from all doctors and over the counters        Ozell America, MD 09/11/2024                "

## 2024-09-11 NOTE — Patient Instructions (Addendum)
 Make sure you check your oxygen saturation  AT  your highest level of activity (not after you stop)   to be sure it stays over 90% and adjust  02 flow upward to maintain this level if needed but remember to turn it back to previous settings when you stop (to conserve your supply).   Goal on 02 is for saturations around 90% and no higher (risks worsening C02 levels)   Plan A = Automatic = Always=    Trelegy 100 one click  each am  and at least 1/2 tablet of prednisone    Work on inhaler technique:  relax and gently blow all the way out then take a nice smooth full deep breath back in,    Hold breath in for at least  5 seconds if you can. Blow out trelegy  thru nose. Rinse and gargle with water when done.  If mouth or throat bother you at all,  try brushing teeth/gums/tongue with arm and hammer toothpaste/ make a slurry and gargle and spit out.     Plan B = Backup (to supplement plan A, not to replace it) Only use your albuterol  inhaler as a rescue medication to be used if you can't catch your breath by resting or doing a relaxed purse lip breathing pattern.  - The less you use it, the better it will work when you need it. - Ok to use the inhaler up to 2 puffs  every 4 hours if you must but call for appointment if use goes up over your usual need - Don't leave home without it !!  (think of it like the spare tire for your car)   Plan C = Crisis (instead of Plan B but only if Plan B stops working) - only use your albuterol  nebulizer if you first try Plan B and it fails to help > ok to use the nebulizer up to every 4 hours but if start needing it regularly call for immediate appointment  Also  Ok to try albuterol  15 min before an activity (on alternating days)  that you know would usually make you short of breath and see if it makes any difference and if makes none then don't take albuterol  after activity unless you can't catch your breath as this means it's the resting that helps, not the albuterol .       Plan D = Deltasone  = Refillable prednisone -  if ABC not working well: - Prednisone  10 mg take  2  daily until better then 1 daily for a week then one half daily is new floor   Plan E = ER - go to ER or call 911 if all else fails    Please schedule a follow up office visit in 6 weeks, call sooner if needed with all medications /inhalers/ solutions in hand so we can verify exactly what you are taking. This includes all medications from all doctors and over the counters

## 2024-09-11 NOTE — Assessment & Plan Note (Addendum)
 Quit smoking 06/2022/MM - Spirometry 06/21/21 FEV1 0.69 (28.8%)  Ratio 0.52  - ECHO 05/16/21 c/w cor pulmonale with RVSP  65   - CTa 05/16/21 c/w emphysema  - 02/26/2023  After extensive coaching inhaler device,  effectiveness =    75% with [facility]  - 02/26/23   alpha one AT  MM   level 184 /  EOS 0.1  - 08/15/2023  After extensive coaching inhaler device,  effectiveness =  80% with dpi > start trelelgy 100    - 01/16/2024 added pred x 6 days and plan D  >>>  09/11/2024 changed plan D to ceiling 20 mg and floor 5 mg  >>>   09/11/2024  After extensive coaching inhaler device,  effectiveness =    75% with DPI (poor insp flows)    Group E in terms of symptoms/risk so  laba/lama/ICS  therefore appropriate rx at this point  >>>  trelegy 100   and approp SABA prn.  Re SABA :  I spent extra time with pt today reviewing appropriate use of albuterol  for prn use on exertion with the following points: 1) saba is for relief of sob that does not improve by walking a slower pace or resting but rather if the pt does not improve after trying this first. 2) If the pt is convinced, as many are, that saba helps recover from activity faster then it's easy to tell if this is the case by re-challenging : ie stop, take the inhaler, then p 5 minutes try the exact same activity (intensity of workload) that just caused the symptoms and see if they are substantially diminished or not after saba 3) if there is an activity that reproducibly causes the symptoms, try the saba 15 min before the activity on alternate days   If in fact the saba really does help, then fine to continue to use it prn but advised may need to look closer at the maintenance regimen being used to achieve better control of airways disease with exertion.

## 2024-09-11 NOTE — Assessment & Plan Note (Addendum)
 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  Total time for H and P, chart review, counseling, reviewing hfa/DPI/elipta/02/ pulse ox  device(s) and generating customized AVS unique to this office visit / same day charting = 35 min post hosp f/u

## 2024-09-17 ENCOUNTER — Telehealth: Payer: Self-pay | Admitting: General Practice

## 2024-09-17 NOTE — Telephone Encounter (Signed)
 Copied from CRM #[id number]. Topic: Appointments - Scheduling Inquiry for Clinic >> Sep 17, 2024  8:41 AM Darshell M wrote: Reason for CRM: Patient scheduled 11/10/2024 but needs hospital follow up within 14 days. Patient CB# [phone]

## 2024-09-17 NOTE — Telephone Encounter (Signed)
 Patient was discharged from [facility]

## 2024-09-30 ENCOUNTER — Ambulatory Visit: Admitting: Internal Medicine

## 2024-10-04 NOTE — ED Provider Notes (Signed)
 "                                                                                   Emergency Department Provider Note    ED Clinical Impression   Final diagnoses:  COPD with acute exacerbation    (CMS-HCC) (Primary)  Acute respiratory failure with hypoxia and hypercapnia (CMS-HCC)    ED Assessment/Plan    Condition: Serious Disposition: Admit  This chart has been completed using Engineer[employer] Civil (consulting) software, and while attempts have been made to ensure accuracy, certain words and phrases may not be transcribed as intended.   History   Chief Complaint  Patient presents with   Shortness of Breath   Altered Mental Status   HPI  Joanne Larson is a 64 y.o. female  who presents today to the  emergency department complaining of cough, congestion, shortness of breath.  Patient states she has had difficulty breathing for the past 3 days.  Cough is productive of clear sputum.  She denies any fever or chills.  Describes her symptoms as moderate to severe.  Patient was given a DuoNeb treatment and Solu-Medrol  125 mg IV by EMS prior to ED arrival.    Allergies: has no known allergies. Medications: is not on any long-term medications. PMHx:  has a past medical history of COPD (chronic obstructive pulmonary disease) (CMS-HCC). PSHx:  has no past surgical history on file. SocHx:   Allergies, Medications, Medical, Surgical, and Social History were reviewed as documented above.   Social Drivers of Health with Concerns   Tobacco Use: Medium Risk (09/11/2024)   Received from [facility]   Patient History    Smoking Tobacco Use: Former    Smokeless Tobacco Use: Never    Passive Exposure: Not on file  Physical Activity: Not on file  Interpersonal Safety: Not on file  Substance Use: Not on file (07/17/2023)  Social Connections: Not on file  Financial Resource Strain: Not on file  Health Literacy: Not on file  Internet Connectivity: Not on file     Review Of  Systems  Review of Systems  Constitutional:  Negative for fever.  HENT:  Negative for congestion.   Respiratory:  Positive for cough, shortness of breath and wheezing. Negative for chest tightness.   Cardiovascular:  Negative for chest pain.  Gastrointestinal:  Negative for abdominal pain.  Skin:  Negative for color change.  Psychiatric/Behavioral:  Negative for behavioral problems.   All other systems reviewed and are negative.   Physical Exam   BP 112/51   Pulse 96   Temp 36.9 C (98.5 F) (Oral)   Resp (!) 24   Ht 157.5 cm (5' 2)   Wt 44.5 kg (98 lb)   SpO2 92%   BMI 17.92 kg/m   Physical Exam Vitals and nursing note reviewed.  Constitutional:      General: She is in acute distress.  HENT:     Head: Normocephalic.  Eyes:     Conjunctiva/sclera: Conjunctivae normal.  Cardiovascular:     Rate and Rhythm: Regular rhythm.     Pulses: Normal pulses.     Heart sounds: Normal heart sounds.  Pulmonary:  Effort: Respiratory distress present.     Breath sounds: Wheezing present.     Comments: Diffuse scattered wheezes appreciated. Abdominal:     General: There is no distension.  Musculoskeletal:        General: No deformity.  Skin:    General: Skin is warm.     Capillary Refill: Capillary refill takes 2 to 3 seconds.     Comments: Normal cap refill.  Neurological:     General: No focal deficit present.  Psychiatric:        Mood and Affect: Mood normal.     ED Course  Medical Decision Making Differential diagnose includes pneumonia versus COPD exacerbation.  12:42 PM Blood gas shows respiratory failure with hypercapnia will put patient on BiPAP.  X-ray does show COPD.  2:19 PM Patient's care discussed with Dr. Heath.  She would like a CT angio before accepting this patient for admission.  Unfortunately, CT is not available at this time.  I do think patient's presentation is consistent with COPD exacerbation and doubt pulmonary embolus.  However, we will  wait until CT is available and get a CT scan.  In the meantime, we will go ahead and hydrate patient as she is now hypotensive.  Chest x-ray shows no evidence of pneumonia.  4:19 PM Pt is doing better clinically. CTA done and is negative. We have her off BIPAP now. Will get repeat ABG.  Pt's care discussed with Dr. Heath. Will admit.  4:22 PM Repeat ABG shows no significant improvement in her ventilation. Will put her back on BIPAP and increase her rate from 16 to 22.   Problems Addressed: Acute respiratory failure with hypoxia and hypercapnia (CMS-HCC): acute illness or injury that poses a threat to life or bodily functions COPD with acute exacerbation    (CMS-HCC): acute illness or injury that poses a threat to life or bodily functions  Amount and/or Complexity of Data Reviewed Independent Historian: EMS Labs: ordered. Decision-making details documented in ED Course. Radiology: ordered and independent interpretation performed. Decision-making details documented in ED Course.    Details: COPD ECG/medicine tests: ordered and independent interpretation performed. Decision-making details documented in ED Course. Discussion of management or test interpretation with external provider(s): Patient care discussed with hospitalist  Risk Prescription drug management. Decision regarding hospitalization.     Critical Care  Performed by: Fote, Ardeen Hanger, MD Authorized by: Heath Chiquita Jansky, MD   Critical care provider statement:    Critical care time (minutes):  45   Critical care time was exclusive of:  Separately billable procedures and treating other patients   Critical care was necessary to treat or prevent imminent or life-threatening deterioration of the following conditions:  Respiratory failure   Critical care was time spent personally by me on the following activities:  Discussions with consultants, ordering and review of radiographic studies, ordering and review of  laboratory studies, re-evaluation of patient's condition, evaluation of patient's response to treatment and examination of patient   Care discussed with: admitting provider      Encounter Date: 10/04/24  ECG 12 Lead  Result Value   EKG Systolic BP    EKG Diastolic BP    EKG Ventricular Rate 95   EKG Atrial Rate 95   EKG P-R Interval 122   EKG QRS Duration 84   EKG Q-T Interval 342   EKG QTC Calculation 429   EKG Calculated P Axis 88   EKG Calculated R Axis 91   EKG Calculated T Axis  78   QTC Fredericia 398   Narrative   Normal sinus rhythm Right atrial enlargement Rightward axis Pulmonary disease pattern Abnormal ECG When compared with ECG of 03-Sep-2024 14:47, No significant change was found Confirmed by Cherie Searle ([id number]) on 10/04/2024 1:52:29 PM     ED Results Results for orders placed or performed during the hospital encounter of 10/04/24  Rapid Influenza / RSV / COVID PCR   Specimen: Nasopharyngeal Swab  Result Value Ref Range   SARS-CoV-2 PCR Negative Negative   Influenza A Negative Negative   Influenza B Negative Negative   RSV Negative Negative  Comprehensive Metabolic Panel  Result Value Ref Range   Sodium 138 135 - 145 mmol/L   Potassium 4.5 3.5 - 5.0 mmol/L   Chloride 94 (L) 98 - 107 mmol/L   CO2 49.7 (H) 21.0 - 32.0 mmol/L   Anion Gap <3 (L) 3 - 11 mmol/L   BUN 11 8 - 20 mg/dL   Creatinine 9.41 (L) 9.39 - 1.10 mg/dL   BUN/Creatinine Ratio 19    eGFR CKD-EPI (2021) Female >90 >=60 mL/min/1.24m2   Glucose 103 70 - 179 mg/dL   Calcium 8.9 8.5 - 89.8 mg/dL   Albumin 3.9 3.5 - 5.0 g/dL   Total Protein 7.7 6.0 - 8.0 g/dL   Total Bilirubin 0.4 0.3 - 1.2 mg/dL   AST 25 15 - 40 U/L   ALT 24 12 - 78 U/L   Alkaline Phosphatase 82 46 - 116 U/L  Lactate Sepsis  Result Value Ref Range   Lactate 0.7 0.5 - 1.9 mmol/L  ECG 12 Lead  Result Value Ref Range   EKG Systolic BP  mmHg   EKG Diastolic BP  mmHg   EKG Ventricular Rate 95 BPM   EKG Atrial Rate 95  BPM   EKG P-R Interval 122 ms   EKG QRS Duration 84 ms   EKG Q-T Interval 342 ms   EKG QTC Calculation 429 ms   EKG Calculated P Axis 88 degrees   EKG Calculated R Axis 91 degrees   EKG Calculated T Axis 78 degrees   QTC Fredericia 398 ms  Arterial Blood Gas  Result Value Ref Range   pH, Arterial 7.30 (L) 7.35 - 7.45   pCO2, Arterial 92.3 (HH) 35.0 - 48.0 mm[Hg]   pO2, Arterial 43 (LL) 83 - 108 mm[Hg]   HCO3 (Bicarbonate), Arterial 45.7 (HH) 18.0 - 23.0 mmol/L   Base Excess, Arterial 15.0 (H) -2.0 - 3.0 mmol/L   O2 Sat, Arterial 81.7 (L) 95.0 - 98.0 %   Total Carbon Dioxide, Arterial 49 (H) 22 - 29 mmol/L   Carboxyhemoglobin 3.4 0.0 - 4.9 %   ABG Allen Test POS    BG Draw Site Radial, left    ABG Comment 6L [place] [employer] CALLED INTO JODI MILLS RN   Arterial Blood Gas  Result Value Ref Range   pH, Arterial 7.27 (L) 7.35 - 7.45   pCO2, Arterial 90.0 (HH) 35.0 - 48.0 mm[Hg]   pO2, Arterial 57 (LL) 83 - 108 mm[Hg]   HCO3 (Bicarbonate), Arterial 41.5 (HH) 18.0 - 23.0 mmol/L   Base Excess, Arterial 11.4 (H) -2.0 - 3.0 mmol/L   O2 Sat, Arterial 90.4 (L) 95.0 - 98.0 %   Total Carbon Dioxide, Arterial 44 (H) 22 - 29 mmol/L   Carboxyhemoglobin 2.8 0.0 - 4.9 %   ABG Allen Test POS    BG Draw Site Radial, right    ABG Comment 6L HFNC  CBC w/ Differential  Result Value Ref Range   WBC 7.8 4.0 - 10.5 10*9/L   RBC 4.46 3.80 - 5.10 10*12/L   HGB 13.1 11.5 - 15.0 g/dL   HCT 55.9 65.9 - 55.9 %   MCV 98.7 (H) 80.0 - 98.0 fL   MCH 29.4 27.0 - 34.0 pg   MCHC 29.8 (L) 32.0 - 36.0 g/dL   RDW 88.2 88.4 - 85.4 %   MPV 8.8 7.4 - 10.4 fL   Platelet 246 140 - 415 10*9/L   Neutrophils % 66.0 %   Lymphocytes % 23.1 %   Monocytes % 9.3 %   Eosinophils % 0.9 %   Basophils % 0.3 %   Absolute Neutrophils 5.1 1.8 - 7.8 10*9/L   Absolute Lymphocytes 1.8 0.7 - 4.5 10*9/L   Absolute Monocytes 0.7 0.1 - 1.0 10*9/L   Absolute Eosinophils 0.1 0.0 - 0.4 10*9/L   Absolute Basophils 0.0 0.0 - 0.2 10*9/L    CTA Chest W Contrast Result Date: 10/04/2024 Exam:  CT Angiogram Chest (Pulmonary Embolism Protocol)  History: Dyspnea, hypoxia, altered mental status and shortness of breath  Technique: Chest CTA with IV contrast (pulmonary embolism protocol). This examination was specifically tailored to evaluate the pulmonary arteries for the presence of intraluminal thrombus.  Volumetric axial CT Maximum Intensity Projection (MIP) pulmonary angiographic images were generated at the scanner and sent to PACS for review. AEC (automated exposure control) and/or manual techniques such as size-specific kV and mAs are employed where appropriate to reduce radiation exposure for all CT exams.  Comparison:  None  Chest CT Findings:  PULMONARY ARTERIES: There is no evidence of central or segmental pulmonary embolism. The main pulmonary artery is normal in caliber.  LUNGS: The central airways are patent. Centrilobular and paraseptal emphysema, moderate to severe. Coarse linear opacities in the right middle lobe and lingula, likely fibrosis. Atelectasis in the costophrenic sulci. No definite edema or consolidation. No suspicious nodules or masses.  PLEURA: No pleural effusion or pneumothorax.  HEART: Normal heart size. No pericardial effusion. Moderate coronary artery calcifications.  AORTA: Atherosclerotic plaquing in the aorta. No dissection or aneurysm.  MEDIASTINUM: There are a few mildly prominent mediastinal and hilar lymph nodes. Index right hilar lymph node measures 13 mm in short axis diameter (71:4), likely reactive.  BONES: Mild degenerative changes throughout the thoracic spine. No aggressive lesions.  SOFT TISSUES: Normal.  UPPER ABDOMEN: Limited view of the upper abdomen is unremarkable.    1.    Negative for central or segmental pulmonary embolism. 2.    Moderate to severe centrilobular and paraseptal emphysema. 3.    No definite edema or consolidation. 4.    Mildly prominent mediastinal and hilar lymph nodes, likely  reactive.    Signed (Electronic Signature): 10/04/2024 4:06 PM Signed By: Deward DELENA Brock, MD  CT head without contrast Result Date: 10/04/2024 Exam: CT Brain without contrast.  History: Altered mental status  Technique: Axial images of the head were obtained from the vertex through the skull base without the administration of contrast.  Sagittal and coronal reformatted images were obtained.   AEC (automated exposure control) and/or manual techniques such as size-specific kV and mAs are employed where appropriate to reduce radiation exposure for all CT exams.  Comparison: None available.  Findings: The images are moderately degraded by motion. There is no gross intraparenchymal or extra-axial hemorrhage. 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 Focal hypoattenuation in the region of the left internal  capsule is suggested (image 30 of series 2). No significant mass effect. No midline shift. The ventricles are not dilated. No acute calvarial abnormality. The mastoid air cells are clear. There is chronic appearing opacification of the left maxillary sinus with reactive hyperostosis.    1.    Images are moderately degraded by motion which decreases the sensitivity of this exam. 2.    Focal hypoattenuation in the region of the left internal capsule may represent age-indeterminate infarct. 3.    Chronic appearing left maxillary sinus disease.   Signed (Electronic Signature): 10/04/2024 4:02 PM Signed By: Ellouise Platt, MD  ECG 12 Lead Result Date: 10/04/2024 Normal sinus rhythm Right atrial enlargement Rightward axis Pulmonary disease pattern Abnormal ECG When compared with ECG of 03-Sep-2024 14:47, No significant change was found Confirmed by Cherie Searle ([id number]) on 10/04/2024 1:52:29 PM  XR Chest Portable Result Date: 10/04/2024 Exam:  Portable Chest  History: Dyspnea, shortness of breath, altered mental status  Technique:  Single frontal view.  Comparison: September 03, 2024  Findings:   Mild hyperinflation. Persistent patchy reticular and coarse linear opacities in the left upper lung, lateral right midlung, similar to prior. These findings are likely related to chronic scarring. No new consolidation or edema. The pleural space is clear. Heart size and mediastinal contours are normal. The bones are unchanged.    1.    Chronic scarring in the left upper lung and right midlung. 2.    No new consolidation or edema.    Signed (Electronic Signature): 10/04/2024 1:05 PM Signed By: Paul A Haugan, MD   Medications Administered:  Medications  sodium chloride  0.9% (NS) bolus 250 mL (0 mL Intravenous Stopped 10/04/24 1353)  ipratropium-albuterol  (DUO-NEB) 0.5-2.5 mg/3 mL nebulizer solution 3 mL (3 mL Nebulization Given 10/04/24 1247)  magnesium  sulfate in D5W 1 gram/100 mL infusion 1 g (0 g Intravenous Stopped 10/04/24 1410)  ipratropium-albuterol  (DUO-NEB) 0.5-2.5 mg/3 mL nebulizer solution 3 mL (3 mL Nebulization Given 10/04/24 1306)  levoFLOXacin (LEVAQUIN) 750 mg/150 mL IVPB 750 mg (0 mg Intravenous Stopped 10/04/24 1442)  sodium chloride  0.9% (NS) bolus 1,335 mL (0 mL Intravenous Stopped 10/04/24 1525)  iohexol (OMNIPAQUE) 350 mg iodine/mL solution 75 mL (75 mL Intravenous Given 10/04/24 1551)    Discharge Medications (Medications Prescribed during this  ED visit and Patient's Home Medications) :    Your Medication List    You have not been prescribed any medications.       Cherie Searle Hanger, MD 10/04/24 1718  "

## 2024-10-07 ENCOUNTER — Telehealth: Payer: Self-pay

## 2024-10-07 NOTE — Telephone Encounter (Signed)
 Copied from CRM #[id number]. Topic: General - Other >> Oct 06, 2024  4:17 PM Dedra B wrote: Reason for CRM: Lauraine from [facility] said patient was discharged with home health orders for nursing, PT, and OT. She needs to know if Dr. Darlean would be willing to follow patient for her orders. Lauraine can be reached at [phone].  Pls advise

## 2024-10-07 NOTE — Telephone Encounter (Signed)
 Sarah from [facility] informed ,verbalized understanding

## 2024-10-07 NOTE — Telephone Encounter (Signed)
 Copied from CRM #[id number]. Topic: Clinical - Order For Equipment >> Oct 07, 2024  3:21 PM Joanne Larson wrote: Reason for CRM: Patient has been in the hospital for 3 days and is out of her to go oxygen tank.  She is unable to get through to [employer].  Her calls are not being answered.  She want to know if Dr. Darlean can respond to [employer] so she can get new tanks.  Please return her call at [phone].  Thanks.  See phone note

## 2024-10-08 ENCOUNTER — Telehealth: Payer: Self-pay

## 2024-10-08 NOTE — Telephone Encounter (Signed)
 Copied from CRM #[id number]. Topic: Clinical - Home Health Verbal Orders >> Oct 08, 2024  8:36 AM Suzen RAMAN wrote: Caller/Agency: [facility] Callback Number: [phone] Service Requested: Occupational Therapy, Physical Therapy, Skilled Nursing Frequency: initial evaluation, frequency TBD-NP appt scheduled for 11/10/24 at 8:00 am with Damien Pringle Any new concerns about the patient? No

## 2024-10-12 ENCOUNTER — Ambulatory Visit: Payer: Self-pay | Admitting: Internal Medicine

## 2024-10-13 ENCOUNTER — Telehealth: Payer: Self-pay | Admitting: *Deleted

## 2024-10-13 NOTE — Telephone Encounter (Signed)
 Duplicate

## 2024-10-13 NOTE — Telephone Encounter (Signed)
 Called [employer] was on hold for 8 minuets unable to stay on the phone - called pt to inform and she stated she got a call today stating the would be bringing out some tanks to her today.

## 2024-10-13 NOTE — Telephone Encounter (Signed)
 Copied from CRM [id number]. Topic: Clinical - Order For Equipment >> Oct 12, 2024 11:36 AM Joanne Larson wrote: Reason for CRM: Patient is calling to state that [employer] was supposed to deliver her oxygen today but they have not. She is out of portable tanks and has an appointment tomorrow and needs these ASAP. She has attempted to call [employer] and has left several messages but nobody has gotten back to her. Patient is requesting we try to contact [employer] to get her oxygen out  to her ASAP.   Patient would like to switch to [employer] in the future. >> Oct 13, 2024 12:20 PM Joanne Larson wrote: Patient states she still has not received any updates from [facility] and they still will not answer any of her phone calls but she is completely out of travel tanks of oxygen and is requesting someone to call her back and assist with this matter. Patient is also very clear that she wants to switch to [facility].   I called [employer]- [phone].  I held for 16 min without speaking with anyone- left detailed msg on their VM that we need a call back ASAP and that pt is out of her travel tanks.   Called the pt and provided her with update.  She states that she spoke with [employer] a few minutes ago and they are delivering her travel tanks today. She still wishes to change DME and is aware that this will need to be handled at her next appt since she will need new qualifying numbers. Nothing further needed at this time.

## 2024-10-27 ENCOUNTER — Ambulatory Visit: Admitting: Internal Medicine

## 2024-11-10 ENCOUNTER — Ambulatory Visit
